# Patient Record
Sex: Female | Born: 1960
Health system: Southern US, Community
[De-identification: ages and names within clinical notes are randomized; demographics above are authoritative.]

## PROBLEM LIST (undated history)

## (undated) DIAGNOSIS — E079 Disorder of thyroid, unspecified: Secondary | ICD-10-CM

## (undated) DIAGNOSIS — E1110 Type 2 diabetes mellitus with ketoacidosis without coma: Secondary | ICD-10-CM

## (undated) DIAGNOSIS — Z8616 Personal history of COVID-19: Secondary | ICD-10-CM

## (undated) DIAGNOSIS — F419 Anxiety disorder, unspecified: Secondary | ICD-10-CM

## (undated) DIAGNOSIS — G8929 Other chronic pain: Secondary | ICD-10-CM

## (undated) DIAGNOSIS — M549 Dorsalgia, unspecified: Secondary | ICD-10-CM

## (undated) DIAGNOSIS — Z8669 Personal history of other diseases of the nervous system and sense organs: Secondary | ICD-10-CM

## (undated) DIAGNOSIS — F32A Depression, unspecified: Secondary | ICD-10-CM

## (undated) DIAGNOSIS — Z872 Personal history of diseases of the skin and subcutaneous tissue: Secondary | ICD-10-CM

## (undated) DIAGNOSIS — E119 Type 2 diabetes mellitus without complications: Secondary | ICD-10-CM

## (undated) DIAGNOSIS — F329 Major depressive disorder, single episode, unspecified: Secondary | ICD-10-CM

## (undated) HISTORY — DX: Personal history of COVID-19: Z86.16

## (undated) HISTORY — DX: Other chronic pain: G89.29

## (undated) HISTORY — DX: Depression, unspecified: F32.A

## (undated) HISTORY — DX: Anxiety disorder, unspecified: F41.9

## (undated) HISTORY — DX: Disorder of thyroid, unspecified: E07.9

## (undated) HISTORY — PX: TUBAL LIGATION: SHX77

## (undated) HISTORY — DX: Major depressive disorder, single episode, unspecified: F32.9

## (undated) HISTORY — PX: POLYPECTOMY: SHX149

## (undated) HISTORY — DX: Type 2 diabetes mellitus with ketoacidosis without coma: E11.10

## (undated) HISTORY — DX: Personal history of other diseases of the nervous system and sense organs: Z86.69

## (undated) HISTORY — DX: Personal history of diseases of the skin and subcutaneous tissue: Z87.2

## (undated) HISTORY — DX: Dorsalgia, unspecified: M54.9

---

## 1999-02-13 ENCOUNTER — Other Ambulatory Visit: Admission: RE | Admit: 1999-02-13 | Discharge: 1999-02-13 | Payer: Self-pay | Admitting: Internal Medicine

## 1999-05-24 ENCOUNTER — Ambulatory Visit (HOSPITAL_COMMUNITY): Admission: RE | Admit: 1999-05-24 | Discharge: 1999-05-24 | Payer: Self-pay | Admitting: Obstetrics and Gynecology

## 2000-09-16 ENCOUNTER — Other Ambulatory Visit: Admission: RE | Admit: 2000-09-16 | Discharge: 2000-09-16 | Payer: Self-pay | Admitting: Internal Medicine

## 2001-10-22 ENCOUNTER — Other Ambulatory Visit: Admission: RE | Admit: 2001-10-22 | Discharge: 2001-10-22 | Payer: Self-pay | Admitting: Internal Medicine

## 2002-10-01 ENCOUNTER — Other Ambulatory Visit: Admission: RE | Admit: 2002-10-01 | Discharge: 2002-10-01 | Payer: Self-pay | Admitting: Internal Medicine

## 2003-04-21 ENCOUNTER — Encounter: Payer: Self-pay | Admitting: Internal Medicine

## 2003-04-21 ENCOUNTER — Encounter: Admission: RE | Admit: 2003-04-21 | Discharge: 2003-04-21 | Payer: Self-pay | Admitting: Internal Medicine

## 2004-02-15 ENCOUNTER — Other Ambulatory Visit: Admission: RE | Admit: 2004-02-15 | Discharge: 2004-02-15 | Payer: Self-pay | Admitting: Internal Medicine

## 2005-06-19 ENCOUNTER — Other Ambulatory Visit: Admission: RE | Admit: 2005-06-19 | Discharge: 2005-06-19 | Payer: Self-pay | Admitting: Internal Medicine

## 2005-07-27 HISTORY — PX: ABDOMINOPLASTY: SUR9

## 2006-04-26 HISTORY — PX: BREAST ENHANCEMENT SURGERY: SHX7

## 2006-06-18 ENCOUNTER — Other Ambulatory Visit: Admission: RE | Admit: 2006-06-18 | Discharge: 2006-06-18 | Payer: Self-pay | Admitting: Internal Medicine

## 2007-06-24 ENCOUNTER — Other Ambulatory Visit: Admission: RE | Admit: 2007-06-24 | Discharge: 2007-06-24 | Payer: Self-pay | Admitting: Internal Medicine

## 2008-09-14 ENCOUNTER — Other Ambulatory Visit: Admission: RE | Admit: 2008-09-14 | Discharge: 2008-09-14 | Payer: Self-pay | Admitting: Internal Medicine

## 2008-09-14 ENCOUNTER — Ambulatory Visit: Payer: Self-pay | Admitting: Internal Medicine

## 2010-07-11 ENCOUNTER — Other Ambulatory Visit: Admission: RE | Admit: 2010-07-11 | Discharge: 2010-07-11 | Payer: Self-pay | Admitting: Internal Medicine

## 2010-07-11 ENCOUNTER — Ambulatory Visit: Payer: Self-pay | Admitting: Internal Medicine

## 2010-08-22 ENCOUNTER — Ambulatory Visit: Payer: Self-pay | Admitting: Internal Medicine

## 2010-10-17 ENCOUNTER — Ambulatory Visit: Payer: Self-pay | Admitting: Internal Medicine

## 2010-10-23 ENCOUNTER — Emergency Department (HOSPITAL_COMMUNITY): Admission: EM | Admit: 2010-10-23 | Discharge: 2010-10-23 | Payer: Self-pay | Admitting: Emergency Medicine

## 2010-12-12 ENCOUNTER — Ambulatory Visit
Admission: RE | Admit: 2010-12-12 | Discharge: 2010-12-12 | Payer: Self-pay | Source: Home / Self Care | Attending: Internal Medicine | Admitting: Internal Medicine

## 2011-02-07 LAB — CBC
HCT: 41.8 % (ref 36.0–46.0)
MCHC: 34.4 g/dL (ref 30.0–36.0)
MCV: 93.7 fL (ref 78.0–100.0)
RDW: 12.7 % (ref 11.5–15.5)

## 2011-02-07 LAB — COMPREHENSIVE METABOLIC PANEL
BUN: 24 mg/dL — ABNORMAL HIGH (ref 6–23)
Calcium: 9.5 mg/dL (ref 8.4–10.5)
Creatinine, Ser: 0.77 mg/dL (ref 0.4–1.2)
Glucose, Bld: 115 mg/dL — ABNORMAL HIGH (ref 70–99)
Total Protein: 7.5 g/dL (ref 6.0–8.3)

## 2011-02-07 LAB — URINALYSIS, ROUTINE W REFLEX MICROSCOPIC
Hgb urine dipstick: NEGATIVE
Nitrite: NEGATIVE
Protein, ur: NEGATIVE mg/dL
Specific Gravity, Urine: 1.028 (ref 1.005–1.030)
Urobilinogen, UA: 0.2 mg/dL (ref 0.0–1.0)

## 2011-02-07 LAB — DIFFERENTIAL
Basophils Absolute: 0 10*3/uL (ref 0.0–0.1)
Basophils Relative: 0 % (ref 0–1)
Lymphocytes Relative: 8 % — ABNORMAL LOW (ref 12–46)
Monocytes Absolute: 0.3 10*3/uL (ref 0.1–1.0)
Neutro Abs: 9.2 10*3/uL — ABNORMAL HIGH (ref 1.7–7.7)
Neutrophils Relative %: 89 % — ABNORMAL HIGH (ref 43–77)

## 2011-02-07 LAB — LIPASE, BLOOD: Lipase: 19 U/L (ref 11–59)

## 2011-02-07 LAB — POCT PREGNANCY, URINE: Preg Test, Ur: NEGATIVE

## 2011-02-27 ENCOUNTER — Ambulatory Visit (INDEPENDENT_AMBULATORY_CARE_PROVIDER_SITE_OTHER): Payer: 59 | Admitting: Internal Medicine

## 2011-02-27 DIAGNOSIS — N958 Other specified menopausal and perimenopausal disorders: Secondary | ICD-10-CM

## 2011-02-27 DIAGNOSIS — E039 Hypothyroidism, unspecified: Secondary | ICD-10-CM

## 2011-04-06 ENCOUNTER — Telehealth: Payer: Self-pay | Admitting: Internal Medicine

## 2011-04-06 NOTE — Telephone Encounter (Signed)
Pt called for instruction on how to take Loestrin.  Instructed pt to take the pill once menses starts, and take it at the same time everyday.  Pt verbalized understanding and denied any further questions.

## 2011-04-18 LAB — HM MAMMOGRAPHY

## 2011-09-05 ENCOUNTER — Encounter: Payer: Self-pay | Admitting: Internal Medicine

## 2011-09-10 ENCOUNTER — Other Ambulatory Visit: Payer: 59 | Admitting: Internal Medicine

## 2011-09-10 DIAGNOSIS — Z0000 Encounter for general adult medical examination without abnormal findings: Secondary | ICD-10-CM

## 2011-09-10 LAB — CBC WITH DIFFERENTIAL/PLATELET
HCT: 38.9 % (ref 36.0–46.0)
Hemoglobin: 12.5 g/dL (ref 12.0–15.0)
Lymphs Abs: 2.1 10*3/uL (ref 0.7–4.0)
MCH: 30.6 pg (ref 26.0–34.0)
Monocytes Absolute: 0.3 10*3/uL (ref 0.1–1.0)
Monocytes Relative: 6 % (ref 3–12)
Neutro Abs: 3.4 10*3/uL (ref 1.7–7.7)
Neutrophils Relative %: 57 % (ref 43–77)
RBC: 4.09 MIL/uL (ref 3.87–5.11)

## 2011-09-10 LAB — LIPID PANEL
Cholesterol: 176 mg/dL (ref 0–200)
Total CHOL/HDL Ratio: 2.8 Ratio
Triglycerides: 54 mg/dL (ref <150)
VLDL: 11 mg/dL (ref 0–40)

## 2011-09-10 LAB — COMPREHENSIVE METABOLIC PANEL
Albumin: 4.2 g/dL (ref 3.5–5.2)
Alkaline Phosphatase: 62 U/L (ref 39–117)
BUN: 20 mg/dL (ref 6–23)
Calcium: 9.4 mg/dL (ref 8.4–10.5)
Creat: 0.83 mg/dL (ref 0.50–1.10)
Glucose, Bld: 111 mg/dL — ABNORMAL HIGH (ref 70–99)
Potassium: 4.1 mEq/L (ref 3.5–5.3)

## 2011-09-11 ENCOUNTER — Other Ambulatory Visit (HOSPITAL_COMMUNITY)
Admission: RE | Admit: 2011-09-11 | Discharge: 2011-09-11 | Disposition: A | Discharge status: Home / Self Care | Payer: 59 | Source: Ambulatory Visit | Attending: Internal Medicine | Admitting: Internal Medicine

## 2011-09-11 ENCOUNTER — Ambulatory Visit (INDEPENDENT_AMBULATORY_CARE_PROVIDER_SITE_OTHER): Payer: 59 | Admitting: Internal Medicine

## 2011-09-11 ENCOUNTER — Encounter: Payer: Self-pay | Admitting: Internal Medicine

## 2011-09-11 DIAGNOSIS — Z0000 Encounter for general adult medical examination without abnormal findings: Secondary | ICD-10-CM

## 2011-09-11 DIAGNOSIS — M549 Dorsalgia, unspecified: Secondary | ICD-10-CM

## 2011-09-11 DIAGNOSIS — Z780 Asymptomatic menopausal state: Secondary | ICD-10-CM

## 2011-09-11 DIAGNOSIS — F329 Major depressive disorder, single episode, unspecified: Secondary | ICD-10-CM

## 2011-09-11 DIAGNOSIS — Z01419 Encounter for gynecological examination (general) (routine) without abnormal findings: Secondary | ICD-10-CM | POA: Insufficient documentation

## 2011-09-11 DIAGNOSIS — Z23 Encounter for immunization: Secondary | ICD-10-CM

## 2011-09-11 LAB — POCT URINALYSIS DIPSTICK
Bilirubin, UA: NEGATIVE
Blood, UA: NEGATIVE
Ketones, UA: NEGATIVE
Leukocytes, UA: NEGATIVE
Spec Grav, UA: 1.005
pH, UA: 7.5

## 2011-09-28 ENCOUNTER — Other Ambulatory Visit: Payer: Self-pay

## 2011-09-28 MED ORDER — NABUMETONE 500 MG PO TABS: 500.0000 mg | ORAL_TABLET | Freq: Two times a day (BID) | ORAL | Status: DC

## 2011-09-30 DIAGNOSIS — E559 Vitamin D deficiency, unspecified: Secondary | ICD-10-CM | POA: Insufficient documentation

## 2011-09-30 DIAGNOSIS — F329 Major depressive disorder, single episode, unspecified: Secondary | ICD-10-CM | POA: Insufficient documentation

## 2011-09-30 DIAGNOSIS — Z780 Asymptomatic menopausal state: Secondary | ICD-10-CM | POA: Insufficient documentation

## 2011-09-30 DIAGNOSIS — M549 Dorsalgia, unspecified: Secondary | ICD-10-CM | POA: Insufficient documentation

## 2011-09-30 NOTE — Patient Instructions (Signed)
Continue low dose Synthroid. Try to get more exercise to help with weight loss. It is possible Lexapro is contributing to inability to lose weight.

## 2011-09-30 NOTE — Progress Notes (Signed)
  Subjective:    Patient ID: Nancy Camacho, female    DOB: [birth year], 50 y.o.   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 Patient is menopausal. Has a history of depression. Has great concern about her weight. She was overweight as a teenager and went to [facility] to lose weight by diet and exercise. Since becoming menopausal has had some issues with weight gain in his been to the bariatric center and has been on phentermine often known for some time. History of Bell's palsy 1992. History of pseudotumor cerebri around 1990 treated by Dr. Sandria Manly. History of breast augmentation 2007, tubal ligation 1997 abdominoplasty 2006. Has annual mammogram. Tdap given August 2011. Gets annual influenza immunization. Have treated her with low dose Synthroid to help with fatigue and weight gain. TSH is checked regularly. History of vitamin D deficiency. Takes Relafen for chronic back pain.    Review of Systems  Constitutional:       Unable to lose weight  HENT: Negative.   Eyes: Negative.   Respiratory: Negative.   Cardiovascular: Negative.   Gastrointestinal: Negative.   Genitourinary:       Hot flashes  Musculoskeletal: Positive for back pain.  Neurological: Negative.   Psychiatric/Behavioral: Positive for dysphoric mood.       Objective:   Physical Exam  Constitutional: She is oriented to person, place, and time. She appears well-developed and well-nourished.  HENT:  Head: Atraumatic.  Right Ear: External ear normal.  Left Ear: External ear normal.  Mouth/Throat: Oropharynx is clear and moist.  Eyes: Conjunctivae and EOM are normal. Pupils are equal, round, and reactive to light. No scleral icterus.  Neck: Normal range of motion. Neck supple. No thyromegaly present.  Cardiovascular: Normal rate, regular rhythm, normal heart sounds and intact distal pulses.   No  murmur heard. Pulmonary/Chest: Effort normal and breath sounds normal. She has no wheezes. She has no rales.  Abdominal: Soft. Bowel sounds are normal. She exhibits no mass. There is no tenderness.  Genitourinary: Vagina normal and uterus normal.  Lymphadenopathy:    She has no cervical adenopathy.  Neurological: She is alert and oriented to person, place, and time. She has normal reflexes. No cranial nerve deficit. Coordination normal.  Skin: Skin is warm and dry. She is not diaphoretic.  Psychiatric: She has a normal mood and affect. Her behavior is normal.          Assessment & Plan:  History of chronic back pain treated with Relafen  History of depression  History of menopausal symptoms  History of pseudotumor cerebri around 1990  History of Bell's palsy 1992  History of vitamin D deficiency  History of fluid retention treated with Maxzide  History of inability to lose weight-etiology unclear. I have been over her diet regimen and 5 that she doesn't eat a great deal and eats mostly healthy foods. Doesn't get exercise because she works a great deal. From time to time have treated her with phentermine 37.5 mg daily. Formerly seen at [facility] center for weight loss.

## 2011-10-08 ENCOUNTER — Ambulatory Visit (INDEPENDENT_AMBULATORY_CARE_PROVIDER_SITE_OTHER): Payer: 59 | Admitting: Internal Medicine

## 2011-10-08 ENCOUNTER — Encounter: Payer: Self-pay | Admitting: Internal Medicine

## 2011-10-08 VITALS — BP 112/72 | HR 84 | Temp 97.9°F | Wt 204.0 lb

## 2011-10-08 DIAGNOSIS — H6693 Otitis media, unspecified, bilateral: Secondary | ICD-10-CM

## 2011-10-08 DIAGNOSIS — H6690 Otitis media, unspecified, unspecified ear: Secondary | ICD-10-CM

## 2011-10-08 NOTE — Progress Notes (Signed)
  Subjective:    Patient ID: Nancy Camacho, female    DOB: 1961-04-11, 50 y.o.   MRN: [id number]  HPI had URI symptoms last week. This morning realized she could not hear music that was playing. Music sounded muffled. No real ear pain.    Review of Systems     Objective:   Physical Exam HEENT exam: Both TMs are red and dull the left more so than the right. Pharynx is clear; neck is supple; chest is clear        Assessment & Plan:  Bilateral otitis media  Plan: Augmentin 500 mg by mouth 3 times daily for 10 days. Take Sudafed PE over-the-counter for decongestant

## 2011-10-08 NOTE — Patient Instructions (Signed)
Take Augmentin by mouth 3 times daily for 10 days. Call if not better in 48 hours or sooner if worse. Take Sudafed PE over-the-counter for decongestant

## 2011-10-09 ENCOUNTER — Encounter: Payer: Self-pay | Admitting: Internal Medicine

## 2011-10-16 ENCOUNTER — Other Ambulatory Visit: Payer: Self-pay | Admitting: Radiology

## 2011-10-17 ENCOUNTER — Encounter: Payer: Self-pay | Admitting: Internal Medicine

## 2011-10-22 ENCOUNTER — Encounter: Payer: Self-pay | Admitting: Internal Medicine

## 2012-01-03 IMAGING — CR DG ABDOMEN ACUTE W/ 1V CHEST
4 series · 4 of 4 positions shown · non-contrast
Comparison: None.

CLINICAL DATA: Upper abdominal pain with nausea and vomiting.

ACUTE ABDOMEN SERIES (ABDOMEN 2 VIEW & CHEST 1 VIEW)

[w chest pa]
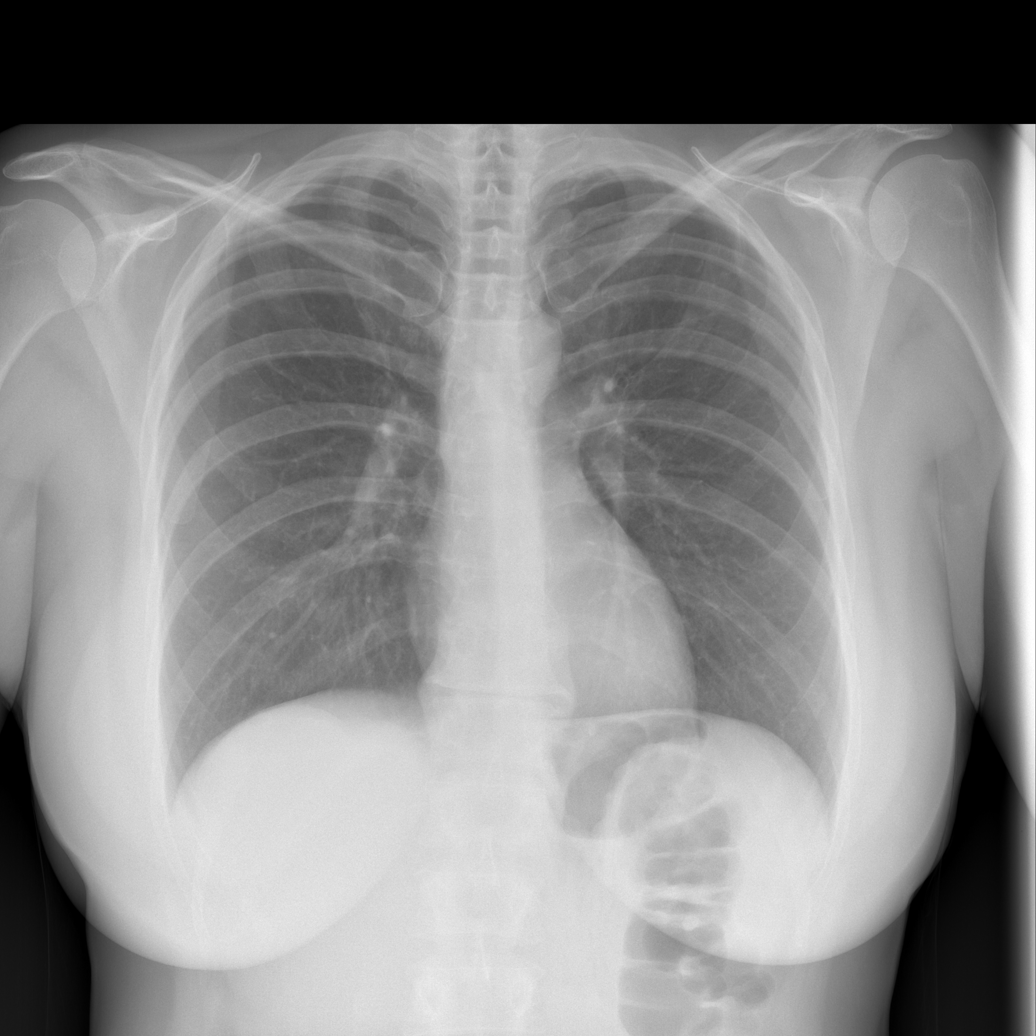

[w abdomen upright *]
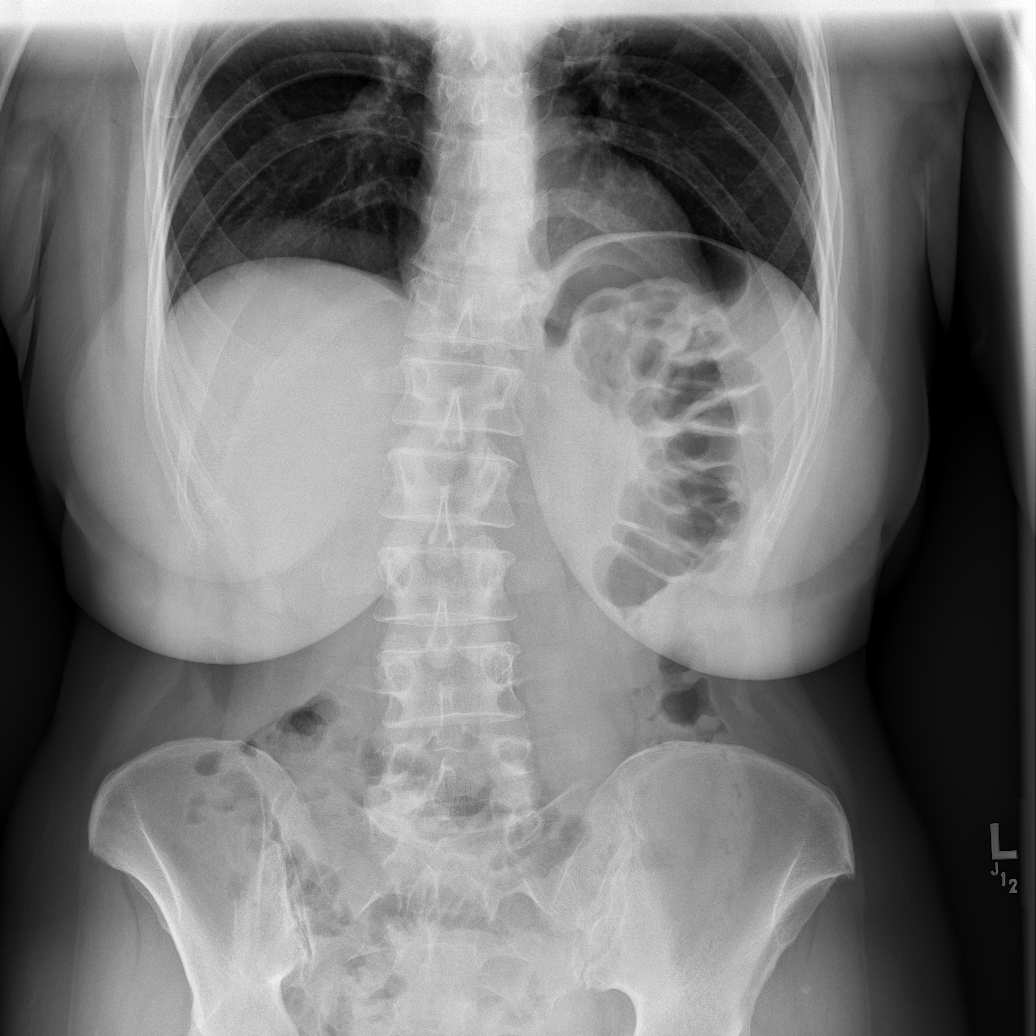

[t abdomen supine (1 of 2)]
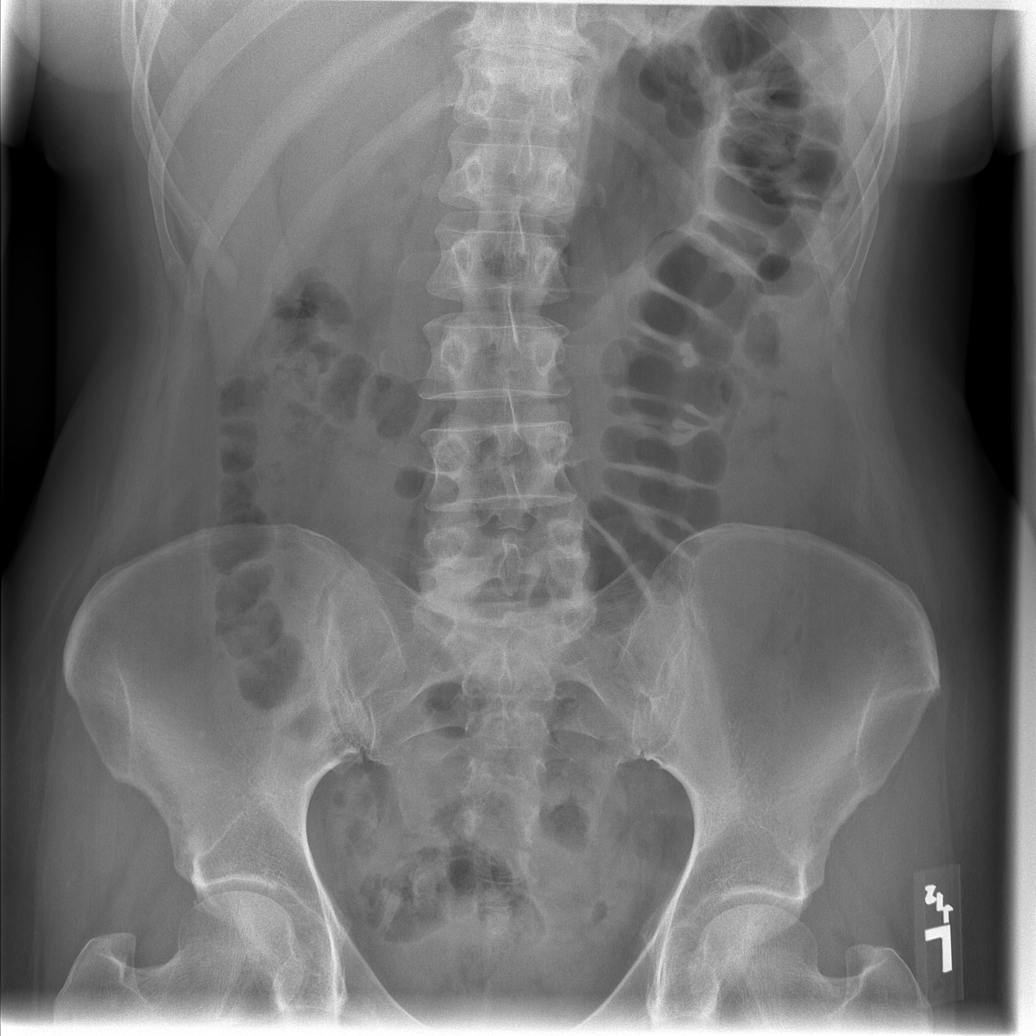

[t abdomen supine (2 of 2)]
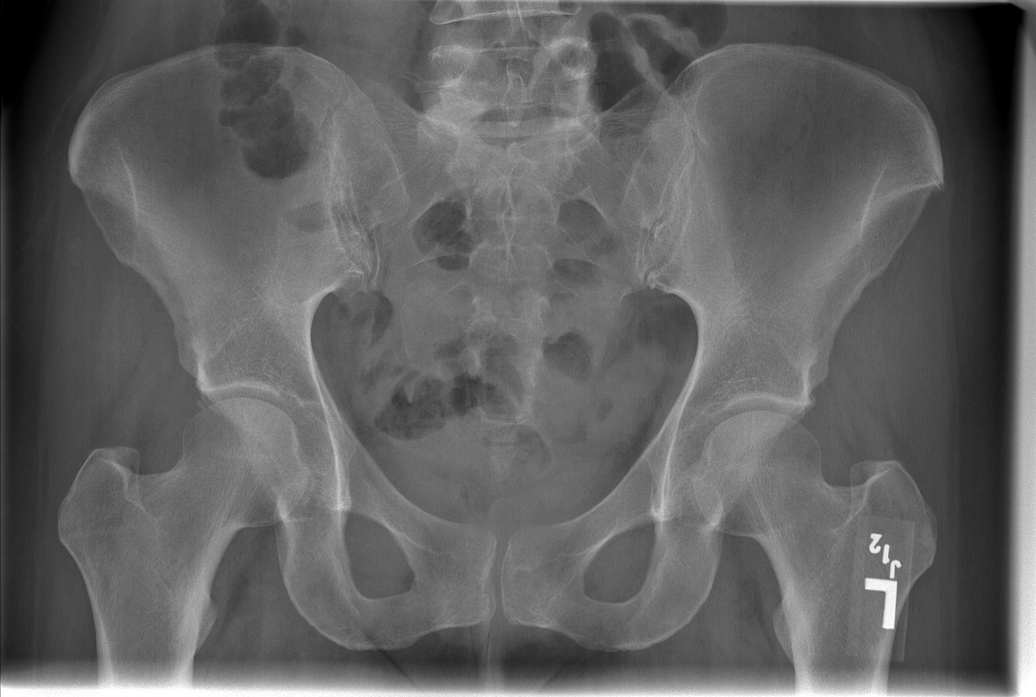

[4 of 4 positions shown; findings below may reference images not displayed]

FINDINGS: Frontal view of the chest shows midline trachea and
normal heart size.  Mild biapical pleural thickening.  Lungs are
otherwise clear.  No pleural fluid.

Two views of the abdomen show nondilated small bowel and colon.  No
unexpected radiopaque calculi.
IMPRESSION: No acute findings.

## 2012-02-12 ENCOUNTER — Encounter: Payer: Self-pay | Admitting: Internal Medicine

## 2012-03-29 ENCOUNTER — Other Ambulatory Visit: Payer: Self-pay | Admitting: Internal Medicine

## 2012-04-15 ENCOUNTER — Ambulatory Visit: Payer: 59 | Admitting: Internal Medicine

## 2012-05-16 ENCOUNTER — Other Ambulatory Visit: Payer: Self-pay | Admitting: Internal Medicine

## 2012-06-23 ENCOUNTER — Other Ambulatory Visit: Payer: Self-pay

## 2012-06-23 MED ORDER — SYNTHROID 50 MCG PO TABS: 50.0000 ug | ORAL_TABLET | Freq: Every day | ORAL | Status: DC

## 2012-07-29 ENCOUNTER — Other Ambulatory Visit: Payer: Self-pay

## 2012-07-29 MED ORDER — SYNTHROID 50 MCG PO TABS: 50.0000 ug | ORAL_TABLET | Freq: Every day | ORAL | Status: DC

## 2012-09-08 ENCOUNTER — Other Ambulatory Visit: Payer: 59 | Admitting: Internal Medicine

## 2012-09-08 DIAGNOSIS — F329 Major depressive disorder, single episode, unspecified: Secondary | ICD-10-CM

## 2012-09-08 DIAGNOSIS — Z0000 Encounter for general adult medical examination without abnormal findings: Secondary | ICD-10-CM

## 2012-09-08 DIAGNOSIS — E039 Hypothyroidism, unspecified: Secondary | ICD-10-CM

## 2012-09-08 LAB — COMPREHENSIVE METABOLIC PANEL
Albumin: 4 g/dL (ref 3.5–5.2)
Alkaline Phosphatase: 60 U/L (ref 39–117)
BUN: 14 mg/dL (ref 6–23)
Calcium: 9.2 mg/dL (ref 8.4–10.5)
Glucose, Bld: 101 mg/dL — ABNORMAL HIGH (ref 70–99)
Potassium: 3.8 mEq/L (ref 3.5–5.3)

## 2012-09-08 LAB — CBC WITH DIFFERENTIAL/PLATELET
Basophils Relative: 1 % (ref 0–1)
HCT: 35.6 % — ABNORMAL LOW (ref 36.0–46.0)
Hemoglobin: 12.1 g/dL (ref 12.0–15.0)
MCH: 31 pg (ref 26.0–34.0)
MCHC: 34 g/dL (ref 30.0–36.0)
MCV: 91.3 fL (ref 78.0–100.0)
Monocytes Absolute: 0.4 10*3/uL (ref 0.1–1.0)
Monocytes Relative: 7 % (ref 3–12)
Neutro Abs: 2.7 10*3/uL (ref 1.7–7.7)

## 2012-09-08 LAB — LIPID PANEL
HDL: 52 mg/dL (ref >39)
LDL Cholesterol: 89 mg/dL (ref 0–99)
Triglycerides: 41 mg/dL (ref <150)

## 2012-09-09 ENCOUNTER — Ambulatory Visit (INDEPENDENT_AMBULATORY_CARE_PROVIDER_SITE_OTHER): Payer: 59 | Admitting: Internal Medicine

## 2012-09-09 ENCOUNTER — Encounter: Payer: Self-pay | Admitting: Internal Medicine

## 2012-09-09 VITALS — BP 116/78 | HR 80 | Temp 98.2°F | Ht 67.5 in | Wt 191.0 lb

## 2012-09-09 DIAGNOSIS — F4321 Adjustment disorder with depressed mood: Secondary | ICD-10-CM

## 2012-09-09 DIAGNOSIS — Z0000 Encounter for general adult medical examination without abnormal findings: Secondary | ICD-10-CM

## 2012-09-09 DIAGNOSIS — G8929 Other chronic pain: Secondary | ICD-10-CM

## 2012-09-09 DIAGNOSIS — Z23 Encounter for immunization: Secondary | ICD-10-CM

## 2012-09-09 DIAGNOSIS — Z139 Encounter for screening, unspecified: Secondary | ICD-10-CM

## 2012-09-09 DIAGNOSIS — M549 Dorsalgia, unspecified: Secondary | ICD-10-CM

## 2012-09-09 LAB — POCT URINALYSIS DIPSTICK
Bilirubin, UA: NEGATIVE
Glucose, UA: NEGATIVE
Ketones, UA: NEGATIVE
Spec Grav, UA: <=1.005

## 2012-09-09 NOTE — Patient Instructions (Addendum)
Continue same meds and return in one year. You have received influenza vaccine today.

## 2012-09-25 NOTE — Progress Notes (Signed)
Subjective:    Patient ID: Nancy Camacho, female    DOB: Feb 14, 1961, 51 y.o.   MRN: [id number]  HPI 51 year old white female in today for health maintenance exam. She sees Valinda Hoar in Dr. Loralie Champagne office for depression. Patient's father is in poor health and has been upsetting to her. History of recurrent low back pain for many years. History of menopausal symptoms and vitamin D deficiency. Says that psychiatrist once EKG done because she is thinking of starting her on attention deficit medication. Patient takes generic Relafen for back pain.  History of Bell's palsy October 1990. Bell's palsy was thought to be related to pseudotumor cerebra and was treated with Diamox for about a year. had abdominoplasty September 2006, breast augmentation June 2007. Bilateral tubal ligation 1997. Tetanus immunization August 2011. Gets annual influenza immunization.  She was overweight in high school but subsequently lost weight. At one point in 1988 she weighed 152 pounds. In 2008 she weighed 178 pounds. She's not pleased with her weight. She's tried phentermine also known for weight control. Was treated with Armour Thyroid by another physician. Her TSH was never abnormal. However I placed her on low dose Synthroid 0.05 mg in 2011 to try to keep her TSH around 1.00 to help with weight loss if possible and energy. We have left her on this.  Social history: She has one stepson. Is married. Quit smoking 1998.  Family history: One sister in good health. Mother with high blood pressure.    Review of Systems  Constitutional:       Unable to lose weight  HENT: Negative.   Eyes: Negative.   Respiratory: Negative.   Cardiovascular: Negative.   Gastrointestinal: Negative.   Genitourinary: Negative.   Musculoskeletal:       History of low back  Neurological: Negative.   Psychiatric/Behavioral: Positive for dysphoric mood.       Objective:   Physical Exam  Vitals reviewed. Constitutional: She is  oriented to person, place, and time. She appears well-developed and well-nourished. No distress.  HENT:  Head: Normocephalic and atraumatic.  Right Ear: External ear normal.  Left Ear: External ear normal.  Mouth/Throat: Oropharynx is clear and moist. No oropharyngeal exudate.  Eyes: EOM are normal. Right eye exhibits no discharge. Left eye exhibits no discharge. No scleral icterus.  Neck: Neck supple. No JVD present. No tracheal deviation present. No thyromegaly present.  Cardiovascular: Normal rate, regular rhythm, normal heart sounds and intact distal pulses.   No murmur heard. Pulmonary/Chest: Effort normal and breath sounds normal. She has no wheezes. She has no rales.  Abdominal: Bowel sounds are normal. She exhibits no distension and no mass. There is no tenderness. There is no rebound.  Genitourinary:       Bimanual normal  Musculoskeletal: She exhibits no edema.  Lymphadenopathy:    She has no cervical adenopathy.  Neurological: She is alert and oriented to person, place, and time. She has normal reflexes. She displays normal reflexes. No cranial nerve deficit. Coordination normal.  Skin: Skin is warm and dry. No rash noted. She is not diaphoretic.  Psychiatric: She has a normal mood and affect. Her behavior is normal. Judgment and thought content normal.       Depressed over her father's illness          Assessment & Plan:  Depression  Menopause  Recurrent low back pain  History of vitamin D deficiency  Unable to lose weight  Plan: Fasting labs drawn and are pending. Influenza  immunization given. Return in one year or as needed. For now continue low dose Synthroid.

## 2012-10-20 ENCOUNTER — Other Ambulatory Visit: Payer: Self-pay

## 2012-10-20 MED ORDER — NABUMETONE 500 MG PO TABS: 500.0000 mg | ORAL_TABLET | Freq: Two times a day (BID) | ORAL | Status: DC

## 2012-12-06 ENCOUNTER — Emergency Department (HOSPITAL_BASED_OUTPATIENT_CLINIC_OR_DEPARTMENT_OTHER)
Admission: EM | Admit: 2012-12-06 | Discharge: 2012-12-06 | Disposition: A | Discharge status: Home / Self Care | Payer: 59 | Attending: Emergency Medicine | Admitting: Emergency Medicine

## 2012-12-06 ENCOUNTER — Encounter (HOSPITAL_BASED_OUTPATIENT_CLINIC_OR_DEPARTMENT_OTHER): Payer: Self-pay | Admitting: Emergency Medicine

## 2012-12-06 DIAGNOSIS — E079 Disorder of thyroid, unspecified: Secondary | ICD-10-CM | POA: Insufficient documentation

## 2012-12-06 DIAGNOSIS — Z8669 Personal history of other diseases of the nervous system and sense organs: Secondary | ICD-10-CM | POA: Insufficient documentation

## 2012-12-06 DIAGNOSIS — Z872 Personal history of diseases of the skin and subcutaneous tissue: Secondary | ICD-10-CM | POA: Insufficient documentation

## 2012-12-06 DIAGNOSIS — M549 Dorsalgia, unspecified: Secondary | ICD-10-CM | POA: Insufficient documentation

## 2012-12-06 DIAGNOSIS — R509 Fever, unspecified: Secondary | ICD-10-CM | POA: Insufficient documentation

## 2012-12-06 DIAGNOSIS — F3289 Other specified depressive episodes: Secondary | ICD-10-CM | POA: Insufficient documentation

## 2012-12-06 DIAGNOSIS — Z79899 Other long term (current) drug therapy: Secondary | ICD-10-CM | POA: Insufficient documentation

## 2012-12-06 DIAGNOSIS — R197 Diarrhea, unspecified: Secondary | ICD-10-CM | POA: Insufficient documentation

## 2012-12-06 DIAGNOSIS — B9789 Other viral agents as the cause of diseases classified elsewhere: Secondary | ICD-10-CM | POA: Insufficient documentation

## 2012-12-06 DIAGNOSIS — G8929 Other chronic pain: Secondary | ICD-10-CM | POA: Insufficient documentation

## 2012-12-06 DIAGNOSIS — F329 Major depressive disorder, single episode, unspecified: Secondary | ICD-10-CM | POA: Insufficient documentation

## 2012-12-06 DIAGNOSIS — B349 Viral infection, unspecified: Secondary | ICD-10-CM

## 2012-12-06 DIAGNOSIS — Z87891 Personal history of nicotine dependence: Secondary | ICD-10-CM | POA: Insufficient documentation

## 2012-12-06 DIAGNOSIS — R112 Nausea with vomiting, unspecified: Secondary | ICD-10-CM | POA: Insufficient documentation

## 2012-12-06 LAB — URINALYSIS, ROUTINE W REFLEX MICROSCOPIC
Bilirubin Urine: NEGATIVE
Hgb urine dipstick: NEGATIVE
Ketones, ur: NEGATIVE mg/dL
Nitrite: NEGATIVE
Urobilinogen, UA: 0.2 mg/dL (ref 0.0–1.0)

## 2012-12-06 LAB — BASIC METABOLIC PANEL
BUN: 23 mg/dL (ref 6–23)
Calcium: 9.3 mg/dL (ref 8.4–10.5)
Creatinine, Ser: 0.6 mg/dL (ref 0.50–1.10)
GFR calc Af Amer: >90 mL/min (ref >90)
GFR calc non Af Amer: >90 mL/min (ref >90)

## 2012-12-06 LAB — CBC WITH DIFFERENTIAL/PLATELET
Basophils Relative: 0 % (ref 0–1)
Eosinophils Absolute: 0 10*3/uL (ref 0.0–0.7)
Eosinophils Relative: 0 % (ref 0–5)
HCT: 40.2 % (ref 36.0–46.0)
Hemoglobin: 13.6 g/dL (ref 12.0–15.0)
MCH: 31.2 pg (ref 26.0–34.0)
MCHC: 33.8 g/dL (ref 30.0–36.0)
MCV: 92.2 fL (ref 78.0–100.0)
Monocytes Absolute: 0.2 10*3/uL (ref 0.1–1.0)
Monocytes Relative: 2 % — ABNORMAL LOW (ref 3–12)
Neutrophils Relative %: 94 % — ABNORMAL HIGH (ref 43–77)

## 2012-12-06 MED ORDER — SODIUM CHLORIDE 0.9 % IV SOLN
Freq: Once | INTRAVENOUS | Status: AC
  Administered 2012-12-06: 19:00:00 via INTRAVENOUS

## 2012-12-06 MED ORDER — IBUPROFEN 800 MG PO TABS
800.0000 mg | ORAL_TABLET | Freq: Once | ORAL | Status: AC
  Administered 2012-12-06: 800 mg via ORAL
  Filled 2012-12-06: qty 1

## 2012-12-06 NOTE — ED Provider Notes (Signed)
History     CSN: [id number]  Arrival date & time 12/06/12  1741   First MD Initiated Contact with Patient 12/06/12 1823      Chief Complaint  Patient presents with  . Illegal value: [    Flu Like Symptoms    (Consider location/radiation/quality/duration/timing/severity/associated sxs/prior treatment) Patient is a 52 y.o. female presenting with musculoskeletal pain. The history is provided by the patient. No language interpreter was used.  Muscle Pain This is a new problem. The current episode started today. The problem occurs constantly. The problem has been gradually worsening. Associated symptoms include myalgias, nausea and vomiting. Nothing aggravates the symptoms. She has tried acetaminophen for the symptoms. The treatment provided no relief.  Pt reports she has body aches, fever, vomiting and diarrhea.   Pt reports she had a flu shot.    Past Medical History  Diagnosis Date  . Depression   . Thyroid disease   . Chronic back pain   . History of seborrhea     ear  . History of Bell's palsy     Past Surgical History  Procedure Date  . Abdominoplasty 07/2005  . Breast enhancement surgery 04/2006  . Tubal ligation     No family history on file.  History  Substance Use Topics  . Smoking status: Former Games developer  . Smokeless tobacco: Not on file  . Alcohol Use: No    OB History    Grav Para Term Preterm Abortions TAB SAB Ect Mult Living                  Review of Systems  Gastrointestinal: Positive for nausea and vomiting.  Musculoskeletal: Positive for myalgias.  All other systems reviewed and are negative.    Allergies  Flexeril  Home Medications   Current Outpatient Rx  Name  Route  Sig  Dispense  Refill  . ALPRAZOLAM 0.5 MG PO TABS               . BUPROPION HCL ER (XL) 150 MG PO TB24               . ESTRIOL POWD               . HYDROCORTISONE 2.5 % EX CREA      APPLY TO RASH SPARINGLY.   30 g   1   . LIOTHYRONINE SODIUM 5 MCG  PO TABS   Oral   Take 5 mcg by mouth daily.         Marland Kitchen NABUMETONE 500 MG PO TABS   Oral   Take 1 tablet (500 mg total) by mouth 2 (two) times daily.   60 tablet   6   . PROGESTERONE MICRONIZED 100 MG PO CAPS   Oral   Take 100 mg by mouth daily.         Marland Kitchen SYNTHROID 50 MCG PO TABS   Oral   Take 1 tablet (50 mcg total) by mouth daily.   30 each   3     Dispense as written.    Due for labs   . VILAZODONE HCL 40 MG PO TABS   Oral   Take by mouth.             BP 121/75  Pulse 97  Temp 98.1 F (36.7 C) (Oral)  Resp 18  Ht 5' 8.5" (1.74 m)  Wt 175 lb (79.379 kg)  BMI 26.22 kg/m2  SpO2 100%  LMP 09/17/2011  Physical Exam  Nursing note and vitals reviewed. Constitutional: She is oriented to person, place, and time. She appears well-developed and well-nourished.  HENT:  Head: Normocephalic and atraumatic.  Right Ear: External ear normal.  Left Ear: External ear normal.  Nose: Nose normal.  Mouth/Throat: Oropharynx is clear and moist.  Eyes: Conjunctivae normal and EOM are normal. Pupils are equal, round, and reactive to light.  Neck: Normal range of motion. Neck supple.  Cardiovascular: Normal rate and normal heart sounds.   Pulmonary/Chest: Effort normal.  Abdominal: Soft. There is no tenderness.  Musculoskeletal: Normal range of motion.  Neurological: She is alert and oriented to person, place, and time. She has normal reflexes.  Skin: Skin is warm.  Psychiatric: She has a normal mood and affect.    ED Course  Procedures (including critical care time)  Labs Reviewed - No data to display No results found.   No diagnosis found.    MDM  Pt reports she thinks she is dehydrated.  Pt requesting IV fluids.  Pt is afebrile vitals normal,  Urine no ketones,  Labs obtained,   Pt given Iv fluids x 1 liter,   Ibuprofen for discomfort. Pt reports she feels some better.    Illness sounds viral, flu like,   Pt did have a flu shot.    I advised follow up with  primary care for recheck on Monday.          Lonia Skinner [place], [place] 12/06/12 2119

## 2012-12-06 NOTE — ED Notes (Signed)
ED PA at bedside

## 2012-12-06 NOTE — ED Notes (Signed)
Pt c/o N/V/D, fever, generalized body aches since 0530 this am. Pt sts took tylenol at 1500 fro pain and Zofran 8mg  at 1000 & 1600.

## 2012-12-06 NOTE — ED Provider Notes (Signed)
Medical screening examination/treatment/procedure(s) were performed by non-physician practitioner and as supervising physician I was immediately available for consultation/collaboration.   Gavin Pound. Oletta Lamas, MD 12/06/12 2125

## 2012-12-06 NOTE — ED Notes (Signed)
Patient ambulatory to the restroom with standby assistance from staff.  Patient given instruction to obtain clean catch UA.

## 2013-05-08 ENCOUNTER — Other Ambulatory Visit: Payer: Self-pay | Admitting: Internal Medicine

## 2013-05-08 ENCOUNTER — Other Ambulatory Visit: Payer: 59 | Admitting: Internal Medicine

## 2013-05-08 DIAGNOSIS — R7301 Impaired fasting glucose: Secondary | ICD-10-CM

## 2013-05-08 LAB — CBC WITH DIFFERENTIAL/PLATELET
Basophils Relative: 1 % (ref 0–1)
Eosinophils Absolute: 0.1 10*3/uL (ref 0.0–0.7)
Eosinophils Relative: 1 % (ref 0–5)
MCH: 30.4 pg (ref 26.0–34.0)
MCHC: 33.9 g/dL (ref 30.0–36.0)
MCV: 89.7 fL (ref 78.0–100.0)
Monocytes Relative: 7 % (ref 3–12)
Neutrophils Relative %: 50 % (ref 43–77)
Platelets: 288 10*3/uL (ref 150–400)

## 2013-05-08 LAB — HEMOGLOBIN A1C: Mean Plasma Glucose: 232 mg/dL — ABNORMAL HIGH (ref <117)

## 2013-05-09 LAB — LIPID PANEL
Cholesterol: 153 mg/dL (ref 0–200)
HDL: 63 mg/dL (ref >39)
LDL Cholesterol: 79 mg/dL (ref 0–99)
Total CHOL/HDL Ratio: 2.4 Ratio
Triglycerides: 53 mg/dL (ref <150)
VLDL: 11 mg/dL (ref 0–40)

## 2013-05-09 LAB — COMPREHENSIVE METABOLIC PANEL
Alkaline Phosphatase: 88 U/L (ref 39–117)
CO2: 28 mEq/L (ref 19–32)
Creat: 0.7 mg/dL (ref 0.50–1.10)
Glucose, Bld: 270 mg/dL — ABNORMAL HIGH (ref 70–99)
Sodium: 134 mEq/L — ABNORMAL LOW (ref 135–145)
Total Bilirubin: 0.8 mg/dL (ref 0.3–1.2)
Total Protein: 6.7 g/dL (ref 6.0–8.3)

## 2013-05-11 ENCOUNTER — Encounter: Payer: Self-pay | Admitting: Internal Medicine

## 2013-05-11 ENCOUNTER — Ambulatory Visit (INDEPENDENT_AMBULATORY_CARE_PROVIDER_SITE_OTHER): Payer: 59 | Admitting: Internal Medicine

## 2013-05-11 VITALS — BP 122/84 | HR 72 | Temp 98.0°F | Wt 175.0 lb

## 2013-05-11 DIAGNOSIS — E119 Type 2 diabetes mellitus without complications: Secondary | ICD-10-CM | POA: Insufficient documentation

## 2013-05-11 LAB — POCT URINALYSIS DIPSTICK
Bilirubin, UA: NEGATIVE
Blood, UA: NEGATIVE
Nitrite, UA: NEGATIVE
Spec Grav, UA: 1.01
pH, UA: 6

## 2013-05-11 MED ORDER — SITAGLIPTIN PHOSPHATE 100 MG PO TABS: 100.0000 mg | ORAL_TABLET | Freq: Every day | ORAL | Status: DC

## 2013-05-14 ENCOUNTER — Telehealth: Payer: Self-pay | Admitting: Internal Medicine

## 2013-05-14 ENCOUNTER — Emergency Department (HOSPITAL_BASED_OUTPATIENT_CLINIC_OR_DEPARTMENT_OTHER)
Admission: EM | Admit: 2013-05-14 | Discharge: 2013-05-14 | Disposition: A | Discharge status: Home / Self Care | Payer: 59 | Attending: Emergency Medicine | Admitting: Emergency Medicine

## 2013-05-14 ENCOUNTER — Encounter (HOSPITAL_BASED_OUTPATIENT_CLINIC_OR_DEPARTMENT_OTHER): Payer: Self-pay | Admitting: *Deleted

## 2013-05-14 DIAGNOSIS — Z87891 Personal history of nicotine dependence: Secondary | ICD-10-CM | POA: Insufficient documentation

## 2013-05-14 DIAGNOSIS — Z8669 Personal history of other diseases of the nervous system and sense organs: Secondary | ICD-10-CM | POA: Insufficient documentation

## 2013-05-14 DIAGNOSIS — F3289 Other specified depressive episodes: Secondary | ICD-10-CM | POA: Insufficient documentation

## 2013-05-14 DIAGNOSIS — Z9851 Tubal ligation status: Secondary | ICD-10-CM | POA: Insufficient documentation

## 2013-05-14 DIAGNOSIS — E119 Type 2 diabetes mellitus without complications: Secondary | ICD-10-CM | POA: Insufficient documentation

## 2013-05-14 DIAGNOSIS — Z872 Personal history of diseases of the skin and subcutaneous tissue: Secondary | ICD-10-CM | POA: Insufficient documentation

## 2013-05-14 DIAGNOSIS — E079 Disorder of thyroid, unspecified: Secondary | ICD-10-CM | POA: Insufficient documentation

## 2013-05-14 DIAGNOSIS — Z8739 Personal history of other diseases of the musculoskeletal system and connective tissue: Secondary | ICD-10-CM | POA: Insufficient documentation

## 2013-05-14 DIAGNOSIS — G8929 Other chronic pain: Secondary | ICD-10-CM | POA: Insufficient documentation

## 2013-05-14 DIAGNOSIS — Z79899 Other long term (current) drug therapy: Secondary | ICD-10-CM | POA: Insufficient documentation

## 2013-05-14 DIAGNOSIS — F329 Major depressive disorder, single episode, unspecified: Secondary | ICD-10-CM | POA: Insufficient documentation

## 2013-05-14 DIAGNOSIS — J3489 Other specified disorders of nose and nasal sinuses: Secondary | ICD-10-CM | POA: Insufficient documentation

## 2013-05-14 HISTORY — DX: Type 2 diabetes mellitus without complications: E11.9

## 2013-05-14 LAB — URINALYSIS, ROUTINE W REFLEX MICROSCOPIC
Bilirubin Urine: NEGATIVE
Glucose, UA: >1000 mg/dL — AB
Hgb urine dipstick: NEGATIVE
Specific Gravity, Urine: 1.015 (ref 1.005–1.030)
Urobilinogen, UA: 0.2 mg/dL (ref 0.0–1.0)
pH: 5.5 (ref 5.0–8.0)

## 2013-05-14 LAB — URINE MICROSCOPIC-ADD ON

## 2013-05-14 NOTE — ED Notes (Signed)
New onset diabetes. States her blood sugars and A1C are elevated. Appears anxious.

## 2013-05-14 NOTE — ED Provider Notes (Signed)
History     CSN: [id number]  Arrival date & time 05/14/13  2058   First MD Initiated Contact with Patient 05/14/13 2114      Chief Complaint  Patient presents with  . Diabetes    (Consider location/radiation/quality/duration/timing/severity/associated sxs/prior treatment) Patient is a 52 y.o. female presenting with diabetes problem. The history is provided by the patient.  Diabetes This is a new problem. Episode onset: 3 days ago. The problem occurs constantly. The problem has not changed since onset.Pertinent negatives include no abdominal pain and no shortness of breath. Associated symptoms comments: States started [place] 3 days ago and blood sugars have not improved despite diet changes.  Spoke with oncall doctor who stated she needed her urine checked for ketones and possible medication changes. Nothing aggravates the symptoms. Nothing relieves the symptoms. Treatments tried: exercise and diet change. The treatment provided no relief.    Past Medical History  Diagnosis Date  . Depression   . Thyroid disease   . Chronic back pain   . History of seborrhea     ear  . History of Bell's palsy   . Diabetes mellitus without complication     Past Surgical History  Procedure Laterality Date  . Abdominoplasty  07/2005  . Breast enhancement surgery  04/2006  . Tubal ligation      No family history on file.  History  Substance Use Topics  . Smoking status: Former Games developer  . Smokeless tobacco: Not on file  . Alcohol Use: No    OB History   Grav Para Term Preterm Abortions TAB SAB Ect Mult Living                  Review of Systems  Constitutional: Negative for fever.  HENT: Positive for congestion and rhinorrhea.   Respiratory: Negative for cough and shortness of breath.   Gastrointestinal: Negative for nausea, vomiting and abdominal pain.  Genitourinary: Negative for dysuria.  All other systems reviewed and are negative.    Allergies  Flexeril  Home  Medications   Current Outpatient Rx  Name  Route  Sig  Dispense  Refill  . ALPRAZolam (XANAX) 0.5 MG tablet               . hydrocortisone 2.5 % cream      APPLY TO RASH SPARINGLY.   30 g   1   . liothyronine (CYTOMEL) 5 MCG tablet   Oral   Take 5 mcg by mouth daily.         . nabumetone (RELAFEN) 500 MG tablet   Oral   Take 1 tablet (500 mg total) by mouth 2 (two) times daily.   60 tablet   6   . sitaGLIPtin (JANUVIA) 100 MG tablet   Oral   Take 1 tablet (100 mg total) by mouth daily.   30 tablet   0   . SYNTHROID 50 MCG tablet   Oral   Take 1 tablet (50 mcg total) by mouth daily.   30 each   3     Dispense as written.    Due for labs   . Vilazodone HCl (VIIBRYD) 40 MG TABS   Oral   Take by mouth.             BP 121/86  Pulse 66  Temp(Src) 97.9 F (36.6 C) (Oral)  Resp 20  SpO2 100%  LMP 09/17/2011  Physical Exam  Nursing note and vitals reviewed. Constitutional: She is oriented to person,  place, and time. She appears well-developed and well-nourished. No distress.  HENT:  Head: Normocephalic and atraumatic.  Mouth/Throat: Oropharynx is clear and moist.  Eyes: Conjunctivae and EOM are normal. Pupils are equal, round, and reactive to light.  Neck: Normal range of motion. Neck supple.  Cardiovascular: Normal rate, regular rhythm and intact distal pulses.   No murmur heard. Pulmonary/Chest: Effort normal and breath sounds normal. No respiratory distress. She has no wheezes. She has no rales.  Abdominal: Soft. She exhibits no distension. There is no tenderness. There is no rebound and no guarding.  Musculoskeletal: Normal range of motion. She exhibits no edema and no tenderness.  Neurological: She is alert and oriented to person, place, and time.  Skin: Skin is warm and dry. No rash noted. No erythema.  Psychiatric: She has a normal mood and affect. Her behavior is normal.    ED Course  Procedures (including critical care time)  Labs  Reviewed  URINALYSIS, ROUTINE W REFLEX MICROSCOPIC - Abnormal; Notable for the following:    Glucose, UA >1000 (*)    All other components within normal limits  URINE MICROSCOPIC-ADD ON   No results found.   1. Diabetes       MDM   Patient diagnosed with diabetes 3 days ago and states she started Januvia to her blood sugar is not improving. She has changed her diet but called her doctor today because her blood sugar remains 2-300 and they told her she needed to have her urine checked for ketones and possibly changing her therapy. She denies any abdominal pain, cramping, nausea, vomiting.  Patient is well appearing with normal vital signs. She has no signs or symptoms concerning for DKA. The patient has not given Januvia adequate time to work and she's only been taking it for 3 days. Urine is within normal limits. Discussed with patient it takes at least 1-2 months to see if the medication will work appropriately. She will follow up with her doctor tomorrow        Gwyneth Sprout, MD 05/14/13 2217

## 2013-05-14 NOTE — Telephone Encounter (Signed)
Per Dr. Lenord Fellers, patient is to start Lantus 5 u qhs. However, when i spoke to her regarding this, she declined. She states, "once you start on Insulin, you never get off". Wants to know if another oral agent can be added to her regimen.

## 2013-05-15 ENCOUNTER — Other Ambulatory Visit (INDEPENDENT_AMBULATORY_CARE_PROVIDER_SITE_OTHER): Payer: 59 | Admitting: Internal Medicine

## 2013-05-15 LAB — KETONES, QUALITATIVE: Acetone, Bld: NEGATIVE

## 2013-05-15 MED ORDER — METFORMIN HCL 500 MG PO TABS: 500.0000 mg | ORAL_TABLET | Freq: Two times a day (BID) | ORAL | Status: DC

## 2013-05-15 NOTE — Patient Instructions (Signed)
Results of stat serum acetone are negative. Patient informed. OK per verbal order of Dr. Lenord Fellers for Metformin 500mg  po bid in addition to the Januvia. We will get appointment with endocrinologist as soon as possible.

## 2013-05-18 NOTE — Patient Instructions (Addendum)
Keep Accu-Cheks before meals 3 times a day. Start Januvia 100 mg daily. Call with Accu-Chek results in a couple of days or sooner if necessary. Guidelines given for normal Accu-Cheks.

## 2013-05-18 NOTE — Progress Notes (Signed)
  Subjective:    Patient ID: Nancy Camacho, female    DOB: 1961-08-07, 52 y.o.   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 Patient tells me that she's lost 20 pounds since February 2014. Apparently had frequent urination and thirst. Did not put these together as possibly being diabetes symptoms although her father had history of diabetes. Has felt fatigued. Was placed with weight loss as her weight is always concerning to her and she's tried various preparations to lose weight. At one point she was being seen at [facility]. Apparently had obesity in childhood years. She had lab work on June 13 showing glucose of 270. Today urine dipstick shows 4+ glucose and small ketones.   Review of Systems     Objective:   Physical Exam not examined today. Spent 30 minutes talking with her about the diagnoses of type 2 diabetes. I do not think she is acidotic. I think we can start her on Januvia 100 mg daily. She is adamant about not being placed on insulin. We will refer her to a dietitian for diabetic diet counseling. We will also see that she is managed by an endocrinologist.        Assessment & Plan:  New onset type 2 diabetes mellitus  Vision changes secondary to diabetes mellitus  Plan: Januvia 100 mg daily. She is to call me in a few days with Accu-Chek readings. Have given her prescription for home glucose monitor diabetic test strips and lancets. Suggested she take Accu-Cheks before meals 3 times a day. She frequently does not eat supper until late evening around 9 PM or so due to work schedule. She may have to change her mealtimes with this diagnosis.

## 2013-05-21 ENCOUNTER — Encounter: Payer: 59 | Attending: Internal Medicine | Admitting: Dietician

## 2013-05-21 ENCOUNTER — Encounter: Payer: Self-pay | Admitting: Dietician

## 2013-05-21 VITALS — Ht 67.5 in | Wt 170.8 lb

## 2013-05-21 DIAGNOSIS — E119 Type 2 diabetes mellitus without complications: Secondary | ICD-10-CM | POA: Insufficient documentation

## 2013-05-21 DIAGNOSIS — Z713 Dietary counseling and surveillance: Secondary | ICD-10-CM | POA: Insufficient documentation

## 2013-05-21 NOTE — Progress Notes (Signed)
Medical Nutrition Therapy:  Appt start time: 1200 end time:  1300.  Assessment:  Primary concerns today: type II DM.   MEDICATIONS: see list  Pt usually works 9-12 and 2-9 M, Tu. 10-5 on W, 10-7 Fri, Thurs 10-7, Sat 1-6. Every other Sunday works.    DIETARY INTAKE:  Usual eating pattern includes 3 meals and 0-1 snacks per day.  Everyday foods include fat free canadian bacon.  Avoided foods include sugar-containing beverages.    24-hr recall:  B ( AM): canadian bacon, one egg, oatmeal or toast, coffee with fat free creamer and truvia  Snk ( AM): none  L ( PM): sandwich or salad with salad dressing with added vinegar Snk ( PM): none D ( PM): various combinations of starch, meat, veg; sometimes just a glucerna shake and veg or english muffin with almond butter Snk ( PM): usually none Beverages: water, lemon water, propel zero, diet grape juice, coffee with truvia and fat free creamer, unsweetened tea  Pt claims to enjoy bars and drinks to assist in meal replacement, as she often struggles to find the time away from work to eat a good meal.  Usual physical activity: pt states little to no activity outside of work. In particular recently she has not been doing much due to family medical issues and long work hours. Has cycled in past, and rides horseback sometimes. Typically on feet for work.   Progress Towards Goal(s):  In progress.   Nutritional Diagnosis:  NI-5.8.4 Inconsistent carbohydrate intake As related to diabetes mellitus type II.  As evidenced by alternating low and high CHO concentration of meals, per pt report and diet recall.    Intervention:  Nutrition counseling provided regarding pathophysiology of type II DM, including lab testing, best times to check BG at home, CHO counting, and importance of physical activity in treatment of hyperglycemia. RD also prescribed CHo controlled diet plan for pt to include 3 meals and one optional snack as below: B: 3 CHO, 3-4 Pro, 2 fat L:  4 CHO, 3-4 Pro, 1+ Veg, 2 fat D: 3 CHO, 3-4 Pro, 1+ Veg, 3 fat Snack: 2 CHO, 2 fat, 0-3 Pro  RD also advised pt on high protein replacement drinks, particularly Premier Protein and Optimum Nutrition Whey protein powder as low CHO alternatives to Glucerna (as pt does not enjoy the taste). Pt also wanted referral to endocrinologist. RD recommended contacting Dr. Ernest Haber (provided her card), and recommended to obtain referral to an endocrinologist of PCP choice if not contacting her office directly.  Handouts given during visit include:  Diabetes Basics handbook  CHO controlled diet plan  Monitoring/Evaluation:  Dietary intake, exercise, portion control, and body weight in 6 week(s).

## 2013-05-26 ENCOUNTER — Ambulatory Visit: Payer: 59 | Admitting: Internal Medicine

## 2013-06-08 ENCOUNTER — Other Ambulatory Visit: Payer: Self-pay | Admitting: Internal Medicine

## 2013-06-30 ENCOUNTER — Other Ambulatory Visit: Payer: Self-pay | Admitting: Internal Medicine

## 2013-06-30 NOTE — Telephone Encounter (Signed)
Did she she endocrinologist?

## 2013-07-06 ENCOUNTER — Ambulatory Visit: Payer: 59 | Admitting: Dietician

## 2013-07-23 ENCOUNTER — Encounter: Payer: Self-pay | Admitting: Internal Medicine

## 2013-09-07 ENCOUNTER — Other Ambulatory Visit: Payer: 59 | Admitting: Internal Medicine

## 2013-09-10 ENCOUNTER — Encounter: Payer: 59 | Admitting: Internal Medicine

## 2013-10-10 ENCOUNTER — Encounter (HOSPITAL_BASED_OUTPATIENT_CLINIC_OR_DEPARTMENT_OTHER): Payer: Self-pay | Admitting: Emergency Medicine

## 2013-10-10 ENCOUNTER — Emergency Department (HOSPITAL_BASED_OUTPATIENT_CLINIC_OR_DEPARTMENT_OTHER): Payer: 59

## 2013-10-10 ENCOUNTER — Emergency Department (HOSPITAL_BASED_OUTPATIENT_CLINIC_OR_DEPARTMENT_OTHER)
Admission: EM | Admit: 2013-10-10 | Discharge: 2013-10-10 | Disposition: A | Discharge status: Home / Self Care | Payer: 59 | Attending: Emergency Medicine | Admitting: Emergency Medicine

## 2013-10-10 DIAGNOSIS — Q790 Congenital diaphragmatic hernia: Secondary | ICD-10-CM

## 2013-10-10 DIAGNOSIS — E079 Disorder of thyroid, unspecified: Secondary | ICD-10-CM | POA: Insufficient documentation

## 2013-10-10 DIAGNOSIS — K449 Diaphragmatic hernia without obstruction or gangrene: Secondary | ICD-10-CM | POA: Insufficient documentation

## 2013-10-10 DIAGNOSIS — F3289 Other specified depressive episodes: Secondary | ICD-10-CM | POA: Insufficient documentation

## 2013-10-10 DIAGNOSIS — F329 Major depressive disorder, single episode, unspecified: Secondary | ICD-10-CM | POA: Insufficient documentation

## 2013-10-10 DIAGNOSIS — E119 Type 2 diabetes mellitus without complications: Secondary | ICD-10-CM | POA: Insufficient documentation

## 2013-10-10 DIAGNOSIS — N12 Tubulo-interstitial nephritis, not specified as acute or chronic: Secondary | ICD-10-CM | POA: Insufficient documentation

## 2013-10-10 DIAGNOSIS — Z872 Personal history of diseases of the skin and subcutaneous tissue: Secondary | ICD-10-CM | POA: Insufficient documentation

## 2013-10-10 DIAGNOSIS — Z3202 Encounter for pregnancy test, result negative: Secondary | ICD-10-CM | POA: Insufficient documentation

## 2013-10-10 DIAGNOSIS — Z8669 Personal history of other diseases of the nervous system and sense organs: Secondary | ICD-10-CM | POA: Insufficient documentation

## 2013-10-10 DIAGNOSIS — Z79899 Other long term (current) drug therapy: Secondary | ICD-10-CM | POA: Insufficient documentation

## 2013-10-10 DIAGNOSIS — G8929 Other chronic pain: Secondary | ICD-10-CM | POA: Insufficient documentation

## 2013-10-10 DIAGNOSIS — Z87891 Personal history of nicotine dependence: Secondary | ICD-10-CM | POA: Insufficient documentation

## 2013-10-10 LAB — URINALYSIS, ROUTINE W REFLEX MICROSCOPIC
Nitrite: NEGATIVE
Protein, ur: 100 mg/dL — AB
Specific Gravity, Urine: 1.011 (ref 1.005–1.030)
Urobilinogen, UA: 1 mg/dL (ref 0.0–1.0)

## 2013-10-10 LAB — PREGNANCY, URINE: Preg Test, Ur: NEGATIVE

## 2013-10-10 LAB — URINE MICROSCOPIC-ADD ON

## 2013-10-10 MED ORDER — LEVOFLOXACIN 750 MG PO TABS
750.0000 mg | ORAL_TABLET | Freq: Once | ORAL | Status: AC
  Administered 2013-10-10: 750 mg via ORAL
  Filled 2013-10-10: qty 1

## 2013-10-10 MED ORDER — CIPROFLOXACIN HCL 500 MG PO TABS: 500.0000 mg | ORAL_TABLET | Freq: Two times a day (BID) | ORAL | Status: DC

## 2013-10-10 MED ORDER — PHENAZOPYRIDINE HCL 200 MG PO TABS: 200.0000 mg | ORAL_TABLET | Freq: Three times a day (TID) | ORAL | Status: DC

## 2013-10-10 MED ORDER — LEVOFLOXACIN 500 MG PO TABS: 500.0000 mg | ORAL_TABLET | Freq: Once | ORAL | Status: DC

## 2013-10-10 MED ORDER — PHENAZOPYRIDINE HCL 100 MG PO TABS
200.0000 mg | ORAL_TABLET | Freq: Once | ORAL | Status: AC
  Administered 2013-10-10: 200 mg via ORAL
  Filled 2013-10-10 (×2): qty 2

## 2013-10-10 NOTE — ED Notes (Signed)
Pt reports inability to urinate since 6pm today.  Reports (R) sided back pain that started at midnight.  Denies N/V/D/fevers

## 2013-10-10 NOTE — ED Provider Notes (Signed)
CSN: [id number]     Arrival date & time 10/10/13  0059 History   First MD Initiated Contact with Patient 10/10/13 0119     Chief Complaint  Patient presents with  . Urinary Retention   (Consider location/radiation/quality/duration/timing/severity/associated sxs/prior Treatment) HPI This is a 52 year old female with difficulty emptying her bladder since 6 PM yesterday evening. She feels the urge to urinate but is only able to void small amounts. She feels as if her bladder is full. She is also having moderate right flank and right lower quadrant pain that started about 90 minutes ago. She denies fever, chills, nausea or vomiting. There are no specific mitigating or exacerbating factors.  Past Medical History  Diagnosis Date  . Depression   . Thyroid disease   . Chronic back pain   . History of seborrhea     ear  . History of Bell's palsy   . Diabetes mellitus without complication    Past Surgical History  Procedure Laterality Date  . Abdominoplasty  07/2005  . Breast enhancement surgery  04/2006  . Tubal ligation     Family History  Problem Relation Age of Onset  . Diabetes Father    History  Substance Use Topics  . Smoking status: Former Games developer  . Smokeless tobacco: Not on file  . Alcohol Use: No   OB History   Grav Para Term Preterm Abortions TAB SAB Ect Mult Living                 Review of Systems  All other systems reviewed and are negative.    Allergies  Flexeril  Home Medications   Current Outpatient Rx  Name  Route  Sig  Dispense  Refill  . ALPRAZolam (XANAX) 0.5 MG tablet      at bedtime as needed.          Marland Kitchen amphetamine-dextroamphetamine (ADDERALL XR) 25 MG 24 hr capsule   Oral   Take 25 mg by mouth every morning.         . hydrocortisone 2.5 % cream      APPLY TO RASH SPARINGLY.   30 g   1   . JANUVIA 100 MG tablet      TAKE 1 TABLET DAILY.   30 tablet   0   . liothyronine (CYTOMEL) 5 MCG tablet   Oral   Take 5 mcg by mouth  daily.         . metFORMIN (GLUCOPHAGE) 500 MG tablet      TAKE  (1)  TABLET TWICE A DAY WITH MEALS (BREAKFAST AND SUPPER)   60 tablet   0   . nabumetone (RELAFEN) 500 MG tablet   Oral   Take 1 tablet (500 mg total) by mouth 2 (two) times daily.   60 tablet   6   . SYNTHROID 50 MCG tablet   Oral   Take 1 tablet (50 mcg total) by mouth daily.   30 each   3     Dispense as written.    Due for labs   . Vilazodone HCl (VIIBRYD) 40 MG TABS   Oral   Take by mouth.            BP 145/91  Pulse 98  Temp(Src) 98 F (36.7 C) (Oral)  Resp 20  Ht 5\' 8"  (1.727 m)  Wt 158 lb (71.668 kg)  BMI 24.03 kg/m2  SpO2 98%  LMP 09/17/2011  Physical Exam General: Well-developed, well-nourished female in no acute  distress; appearance consistent with age of record HENT: normocephalic; atraumatic Eyes: pupils equal, round and reactive to light; extraocular muscles intact Neck: supple Heart: regular rate and rhythm Lungs: clear to auscultation bilaterally Abdomen: soft; nondistended; suprapubic tenderness; no masses or hepatosplenomegaly; bowel sounds present GU: Right CVA tenderness Extremities: No deformity; full range of motion Neurologic: Awake, alert and oriented; motor function intact in all extremities and symmetric; no facial droop Skin: Warm and dry Psychiatric: Normal mood and affect    ED Course  Procedures (including critical care time)      MDM   Nursing notes and vitals signs, including pulse oximetry, reviewed.  Summary of this visit's results, reviewed by myself:  Labs:  Results for orders placed during the hospital encounter of 10/10/13 (from the past 24 hour(s))  URINALYSIS, ROUTINE W REFLEX MICROSCOPIC     Status: Abnormal   Collection Time    10/10/13  1:10 AM      Result Value Range   Color, Urine YELLOW  YELLOW   APPearance TURBID (*) CLEAR   Specific Gravity, Urine 1.011  1.005 - 1.030   pH 7.0  5.0 - 8.0   Glucose, UA NEGATIVE  NEGATIVE  mg/dL   Hgb urine dipstick LARGE (*) NEGATIVE   Bilirubin Urine NEGATIVE  NEGATIVE   Ketones, ur NEGATIVE  NEGATIVE mg/dL   Protein, ur 454 (*) NEGATIVE mg/dL   Urobilinogen, UA 1.0  0.0 - 1.0 mg/dL   Nitrite NEGATIVE  NEGATIVE   Leukocytes, UA LARGE (*) NEGATIVE  PREGNANCY, URINE     Status: None   Collection Time    10/10/13  1:10 AM      Result Value Range   Preg Test, Ur NEGATIVE  NEGATIVE  URINE MICROSCOPIC-ADD ON     Status: Abnormal   Collection Time    10/10/13  1:10 AM      Result Value Range   Squamous Epithelial / LPF RARE  RARE   WBC, UA TOO NUMEROUS TO COUNT  <3 WBC/hpf   RBC / HPF TOO NUMEROUS TO COUNT  <3 RBC/hpf   Bacteria, UA MANY (*) RARE    Imaging Studies: Ct Abdomen Pelvis Wo Contrast  10/10/2013   CLINICAL DATA:  Right-sided back pain, flank pain  EXAM: CT ABDOMEN AND PELVIS WITHOUT CONTRAST  TECHNIQUE: Multidetector CT imaging of the abdomen and pelvis was performed following the standard protocol without intravenous contrast.  COMPARISON:  Prior radiograph from 10/23/2010  FINDINGS: The visualized lung bases are clear. Large fat containing Bochdalek's hernia is noted on the right. Bilateral breast implants are partially visualized.  The liver demonstrates a normal unenhanced appearance. The gallbladder, spleen, adrenal glands, and pancreas demonstrate a normal unenhanced appearance.  The kidneys are symmetric in size without evidence of nephrolithiasis or hydronephrosis. No stones are seen along the course of either renal collecting system.  There is no evidence of bowel obstruction. Appendix is well visualized within the right lower quadrant and is of normal caliber and appearance without associated inflammatory changes to suggest acute appendicitis. Metallic clip is noted posterior to the right hepatic lobe.  There is mild circumferential bladder wall thickening. While this may be related to incomplete distension, possible cystitis could also have this appearance.  Uterus and ovaries are within normal limits.  No free air or fluid. No pathologically enlarged intra-abdominal pelvic lymph nodes.  Osseous structures are within normal limits.  IMPRESSION: 1. No CT evidence of nephrolithiasis or obstructive uropathy. 2. Mild diffuse circumferential bladder wall  thickening. While this finding may be related to incomplete distension, possible cystitis could also have this appearance. Correlation with urinalysis recommended. 3. Normal appendix. 4. Fat containing right-sided Bochdalek hernia.   Electronically Signed   By: Rise Mu M.D.   On: 10/10/2013 02:50        Hanley Seamen, MD 10/10/13 [phone]

## 2013-10-12 LAB — URINE CULTURE

## 2013-10-16 ENCOUNTER — Encounter: Payer: 59 | Admitting: Internal Medicine

## 2013-12-16 ENCOUNTER — Other Ambulatory Visit: Payer: Self-pay | Admitting: Internal Medicine

## 2013-12-18 ENCOUNTER — Encounter: Payer: 59 | Admitting: Internal Medicine

## 2014-01-08 ENCOUNTER — Encounter: Payer: Self-pay | Admitting: Internal Medicine

## 2014-03-03 ENCOUNTER — Ambulatory Visit (AMBULATORY_SURGERY_CENTER): Payer: Self-pay

## 2014-03-03 VITALS — Ht 68.0 in | Wt 154.0 lb

## 2014-03-03 DIAGNOSIS — Z1211 Encounter for screening for malignant neoplasm of colon: Secondary | ICD-10-CM

## 2014-03-03 MED ORDER — MOVIPREP 100 G PO SOLR: 1.0000 | Freq: Once | ORAL | Status: DC

## 2014-03-08 ENCOUNTER — Encounter: Payer: Self-pay | Admitting: Internal Medicine

## 2014-03-15 ENCOUNTER — Ambulatory Visit (AMBULATORY_SURGERY_CENTER): Payer: 59 | Admitting: Internal Medicine

## 2014-03-15 ENCOUNTER — Encounter: Payer: Self-pay | Admitting: Internal Medicine

## 2014-03-15 VITALS — BP 103/63 | HR 52 | Temp 97.7°F | Resp 19 | Ht 68.0 in | Wt 154.0 lb

## 2014-03-15 DIAGNOSIS — D126 Benign neoplasm of colon, unspecified: Secondary | ICD-10-CM

## 2014-03-15 DIAGNOSIS — Z1211 Encounter for screening for malignant neoplasm of colon: Secondary | ICD-10-CM

## 2014-03-15 HISTORY — PX: COLONOSCOPY: SHX174

## 2014-03-15 MED ORDER — SODIUM CHLORIDE 0.9 % IV SOLN: 500.0000 mL | INTRAVENOUS | Status: DC

## 2014-03-15 NOTE — Op Note (Signed)
[facility]  [address] [place], [address]   COLONOSCOPY PROCEDURE REPORT  PATIENT: Nancy Camacho, Nancy Camacho  MR#: [id number] BIRTHDATE: [birth year] , 52  yrs. old GENDER: Female ENDOSCOPIST: Jerene Bears, MD REFERRED XB:[id number] Tisovec, M.D. PROCEDURE DATE:  03/15/2014 PROCEDURE:   Colonoscopy with snare polypectomy and Colonoscopy with cold biopsy polypectomy First Screening Colonoscopy - Avg.  risk and is 50 yrs.  old or older Yes.  Prior Negative Screening - Now for repeat screening. N/A  History of Adenoma - Now for follow-up colonoscopy & has been > or = to 3 yrs.  N/A  Polyps Removed Today? Yes. ASA CLASS:   Class III INDICATIONS:average risk screening and first colonoscopy. MEDICATIONS: MAC sedation, administered by CRNA and propofol (Diprivan) 300mg  IV  DESCRIPTION OF PROCEDURE:   After the risks benefits and alternatives of the procedure were thoroughly explained, informed consent was obtained.  A digital rectal exam revealed no rectal mass.   The LB [id number]  endoscope was introduced through the anus and advanced to the terminal ileum which was intubated for a short distance. No adverse events experienced.   The quality of the prep was Moviprep fair requiring copious irrigation and lavage. The instrument was then slowly withdrawn as the colon was fully examined.   COLON FINDINGS: Normal terminal ileum. Two sessile polyps measuring 3 and 5 mm in size were found at the cecum and hepatic flexure. Polypectomy was performed with cold forceps and using cold snare. All resections were complete and all polyp tissue was completely retrieved.   There was mild scattered diverticulosis noted in the left colon.   Mild melanosis was found throughout the entire examined colon.  Retroflexed views revealed no abnormalities. The time to cecum=4 minutes 34 seconds.  Withdrawal time=17 minutes 31 seconds.  The scope was withdrawn and the procedure  completed. COMPLICATIONS: There were no complications.  ENDOSCOPIC IMPRESSION: 1.   Two sessile polyps measuring 3 and 5 mm in size were found at the cecum and hepatic flexure; Polypectomy was performed with cold forceps and using cold snare 2.   There was mild diverticulosis noted in the left colon 3.   Mild melanosis was found throughout the entire examined colon  RECOMMENDATIONS: 1.  Await pathology results 2.  High fiber diet 3.  Timing of repeat colonoscopy will be determined by pathology findings. 4.  You will receive a letter within 1-2 weeks with the results of your biopsy as well as final recommendations.  Please call my office if you have not received a letter after 3 weeks.  eSigned:  Jerene Bears, MD 03/15/2014 8:36 AM      cc: The Patient and Domenick Gong, MD

## 2014-03-15 NOTE — Patient Instructions (Signed)
YOU HAD AN ENDOSCOPIC PROCEDURE TODAY AT THE [facility]: Refer to the procedure report that was given to you for any specific questions about what was found during the examination.  If the procedure report does not answer your questions, please call your gastroenterologist to clarify.  If you requested that your care partner not be given the details of your procedure findings, then the procedure report has been included in a sealed envelope for you to review at your convenience later.  YOU SHOULD EXPECT: Some feelings of bloating in the abdomen. Passage of more gas than usual.  Walking can help get rid of the air that was put into your GI tract during the procedure and reduce the bloating. If you had a lower endoscopy (such as a colonoscopy or flexible sigmoidoscopy) you may notice spotting of blood in your stool or on the toilet paper. If you underwent a bowel prep for your procedure, then you may not have a normal bowel movement for a few days.  DIET: Your first meal following the procedure should be a light meal and then it is ok to progress to your normal diet.  A half-sandwich or bowl of soup is an example of a good first meal.  Heavy or fried foods are harder to digest and may make you feel nauseous or bloated.  Likewise meals heavy in dairy and vegetables can cause extra gas to form and this can also increase the bloating.  Drink plenty of fluids but you should avoid alcoholic beverages for 24 hours.  ACTIVITY: Your care partner should take you home directly after the procedure.  You should plan to take it easy, moving slowly for the rest of the day.  You can resume normal activity the day after the procedure however you should NOT DRIVE or use heavy machinery for 24 hours (because of the sedation medicines used during the test).    SYMPTOMS TO REPORT IMMEDIATELY: A gastroenterologist can be reached at any hour.  During normal business hours, 8:30 AM to 5:00 PM Monday through Friday,  call [phone].  After hours and on weekends, please call the GI answering service at [phone] who will take a message and have the physician on call contact you.   Following lower endoscopy (colonoscopy or flexible sigmoidoscopy):  Excessive amounts of blood in the stool  Significant tenderness or worsening of abdominal pains  Swelling of the abdomen that is new, acute  Fever of 100F or higher    FOLLOW UP: If any biopsies were taken you will be contacted by phone or by letter within the next 1-3 weeks.  Call your gastroenterologist if you have not heard about the biopsies in 3 weeks.  Our staff will call the home number listed on your records the next business day following your procedure to check on you and address any questions or concerns that you may have at that time regarding the information given to you following your procedure. This is a courtesy call and so if there is no answer at the home number and we have not heard from you through the emergency physician on call, we will assume that you have returned to your regular daily activities without incident.  SIGNATURES/CONFIDENTIALITY: You and/or your care partner have signed paperwork which will be entered into your electronic medical record.  These signatures attest to the fact that that the information above on your After Visit Summary has been reviewed and is understood.  Full responsibility of the confidentiality   of this discharge information lies with you and/or your care-partner.  Polyp, diverticulosis, and high fiber diet information given.    Dr. Hilarie Fredrickson will advise you about recall colonoscopy after pathology reports are reviewed.

## 2014-03-15 NOTE — Progress Notes (Signed)
Report to pacu rn, vss, bbs=clear 

## 2014-03-15 NOTE — Progress Notes (Signed)
Called to room to assist during endoscopic procedure.  Patient ID and intended procedure confirmed with present staff. Received instructions for my participation in the procedure from the performing physician.  

## 2014-03-16 ENCOUNTER — Telehealth: Payer: Self-pay | Admitting: *Deleted

## 2014-03-16 NOTE — Telephone Encounter (Signed)
  Follow up Call-  Call back number 03/15/2014  Post procedure Call Back phone  # [phone]  Permission to leave phone message No     Patient questions:  Do you have a fever, pain , or abdominal swelling? no Pain Score  0 *  Have you tolerated food without any problems? yes  Have you been able to return to your normal activities? yes  Do you have any questions about your discharge instructions: Diet   no Medications  no Follow up visit  no  Do you have questions or concerns about your Care? no  Actions: * If pain score is 4 or above: No action needed, pain <4.

## 2014-03-29 ENCOUNTER — Encounter: Payer: Self-pay | Admitting: Internal Medicine

## 2014-11-10 ENCOUNTER — Encounter: Payer: Self-pay | Admitting: Internal Medicine

## 2015-08-29 ENCOUNTER — Other Ambulatory Visit: Payer: Self-pay | Admitting: Obstetrics and Gynecology

## 2015-08-31 LAB — CYTOLOGY - PAP

## 2017-01-18 ENCOUNTER — Ambulatory Visit (INDEPENDENT_AMBULATORY_CARE_PROVIDER_SITE_OTHER): Payer: 59 | Admitting: Psychology

## 2017-01-18 DIAGNOSIS — F4323 Adjustment disorder with mixed anxiety and depressed mood: Secondary | ICD-10-CM | POA: Diagnosis not present

## 2017-01-24 DIAGNOSIS — Z6821 Body mass index (BMI) 21.0-21.9, adult: Secondary | ICD-10-CM | POA: Diagnosis not present

## 2017-01-24 DIAGNOSIS — E119 Type 2 diabetes mellitus without complications: Secondary | ICD-10-CM | POA: Diagnosis not present

## 2017-02-15 ENCOUNTER — Ambulatory Visit (INDEPENDENT_AMBULATORY_CARE_PROVIDER_SITE_OTHER): Payer: 59 | Admitting: Psychology

## 2017-02-15 DIAGNOSIS — F4323 Adjustment disorder with mixed anxiety and depressed mood: Secondary | ICD-10-CM

## 2017-03-19 DIAGNOSIS — E038 Other specified hypothyroidism: Secondary | ICD-10-CM | POA: Diagnosis not present

## 2017-03-19 DIAGNOSIS — E119 Type 2 diabetes mellitus without complications: Secondary | ICD-10-CM | POA: Diagnosis not present

## 2017-03-19 DIAGNOSIS — Z1389 Encounter for screening for other disorder: Secondary | ICD-10-CM | POA: Diagnosis not present

## 2017-03-19 DIAGNOSIS — E784 Other hyperlipidemia: Secondary | ICD-10-CM | POA: Diagnosis not present

## 2017-03-19 DIAGNOSIS — E048 Other specified nontoxic goiter: Secondary | ICD-10-CM | POA: Diagnosis not present

## 2017-03-29 ENCOUNTER — Ambulatory Visit (INDEPENDENT_AMBULATORY_CARE_PROVIDER_SITE_OTHER): Payer: 59 | Admitting: Psychology

## 2017-03-29 DIAGNOSIS — F4323 Adjustment disorder with mixed anxiety and depressed mood: Secondary | ICD-10-CM | POA: Diagnosis not present

## 2017-05-01 DIAGNOSIS — Z6820 Body mass index (BMI) 20.0-20.9, adult: Secondary | ICD-10-CM | POA: Diagnosis not present

## 2017-05-01 DIAGNOSIS — E119 Type 2 diabetes mellitus without complications: Secondary | ICD-10-CM | POA: Diagnosis not present

## 2017-05-24 ENCOUNTER — Ambulatory Visit (INDEPENDENT_AMBULATORY_CARE_PROVIDER_SITE_OTHER): Payer: 59 | Admitting: Psychology

## 2017-05-24 DIAGNOSIS — F4323 Adjustment disorder with mixed anxiety and depressed mood: Secondary | ICD-10-CM | POA: Diagnosis not present

## 2017-06-12 DIAGNOSIS — R8299 Other abnormal findings in urine: Secondary | ICD-10-CM | POA: Diagnosis not present

## 2017-06-12 DIAGNOSIS — R81 Glycosuria: Secondary | ICD-10-CM | POA: Diagnosis not present

## 2017-06-12 DIAGNOSIS — K644 Residual hemorrhoidal skin tags: Secondary | ICD-10-CM | POA: Diagnosis not present

## 2017-06-12 DIAGNOSIS — R300 Dysuria: Secondary | ICD-10-CM | POA: Diagnosis not present

## 2017-06-26 DIAGNOSIS — Z6820 Body mass index (BMI) 20.0-20.9, adult: Secondary | ICD-10-CM | POA: Diagnosis not present

## 2017-06-26 DIAGNOSIS — R233 Spontaneous ecchymoses: Secondary | ICD-10-CM | POA: Diagnosis not present

## 2017-06-26 DIAGNOSIS — E119 Type 2 diabetes mellitus without complications: Secondary | ICD-10-CM | POA: Diagnosis not present

## 2017-07-19 ENCOUNTER — Ambulatory Visit (INDEPENDENT_AMBULATORY_CARE_PROVIDER_SITE_OTHER): Payer: 59 | Admitting: Psychology

## 2017-07-19 DIAGNOSIS — F4323 Adjustment disorder with mixed anxiety and depressed mood: Secondary | ICD-10-CM

## 2017-08-05 DIAGNOSIS — Z23 Encounter for immunization: Secondary | ICD-10-CM | POA: Diagnosis not present

## 2017-08-05 DIAGNOSIS — E038 Other specified hypothyroidism: Secondary | ICD-10-CM | POA: Diagnosis not present

## 2017-08-05 DIAGNOSIS — E119 Type 2 diabetes mellitus without complications: Secondary | ICD-10-CM | POA: Diagnosis not present

## 2017-08-07 DIAGNOSIS — E119 Type 2 diabetes mellitus without complications: Secondary | ICD-10-CM | POA: Diagnosis not present

## 2017-08-30 ENCOUNTER — Ambulatory Visit (INDEPENDENT_AMBULATORY_CARE_PROVIDER_SITE_OTHER): Payer: 59 | Admitting: Psychology

## 2017-08-30 DIAGNOSIS — F4323 Adjustment disorder with mixed anxiety and depressed mood: Secondary | ICD-10-CM | POA: Diagnosis not present

## 2017-09-04 DIAGNOSIS — Z01419 Encounter for gynecological examination (general) (routine) without abnormal findings: Secondary | ICD-10-CM | POA: Diagnosis not present

## 2017-09-04 DIAGNOSIS — Z6820 Body mass index (BMI) 20.0-20.9, adult: Secondary | ICD-10-CM | POA: Diagnosis not present

## 2017-09-18 DIAGNOSIS — Z794 Long term (current) use of insulin: Secondary | ICD-10-CM | POA: Diagnosis not present

## 2017-09-18 DIAGNOSIS — E119 Type 2 diabetes mellitus without complications: Secondary | ICD-10-CM | POA: Diagnosis not present

## 2017-10-25 ENCOUNTER — Ambulatory Visit: Payer: 59 | Admitting: Psychology

## 2017-10-25 DIAGNOSIS — F4323 Adjustment disorder with mixed anxiety and depressed mood: Secondary | ICD-10-CM | POA: Diagnosis not present

## 2017-10-31 DIAGNOSIS — Z794 Long term (current) use of insulin: Secondary | ICD-10-CM | POA: Diagnosis not present

## 2017-10-31 DIAGNOSIS — Z6820 Body mass index (BMI) 20.0-20.9, adult: Secondary | ICD-10-CM | POA: Diagnosis not present

## 2017-10-31 DIAGNOSIS — E119 Type 2 diabetes mellitus without complications: Secondary | ICD-10-CM | POA: Diagnosis not present

## 2017-11-01 DIAGNOSIS — Z803 Family history of malignant neoplasm of breast: Secondary | ICD-10-CM | POA: Diagnosis not present

## 2017-11-01 DIAGNOSIS — Z801 Family history of malignant neoplasm of trachea, bronchus and lung: Secondary | ICD-10-CM | POA: Diagnosis not present

## 2017-11-01 DIAGNOSIS — Z86010 Personal history of colonic polyps: Secondary | ICD-10-CM | POA: Diagnosis not present

## 2017-12-11 DIAGNOSIS — N390 Urinary tract infection, site not specified: Secondary | ICD-10-CM | POA: Diagnosis not present

## 2017-12-11 DIAGNOSIS — R3914 Feeling of incomplete bladder emptying: Secondary | ICD-10-CM | POA: Diagnosis not present

## 2017-12-18 DIAGNOSIS — E119 Type 2 diabetes mellitus without complications: Secondary | ICD-10-CM | POA: Diagnosis not present

## 2017-12-20 ENCOUNTER — Ambulatory Visit: Payer: 59 | Admitting: Psychology

## 2017-12-20 DIAGNOSIS — F411 Generalized anxiety disorder: Secondary | ICD-10-CM

## 2018-01-28 DIAGNOSIS — Z794 Long term (current) use of insulin: Secondary | ICD-10-CM | POA: Diagnosis not present

## 2018-01-28 DIAGNOSIS — Z6821 Body mass index (BMI) 21.0-21.9, adult: Secondary | ICD-10-CM | POA: Diagnosis not present

## 2018-01-28 DIAGNOSIS — E119 Type 2 diabetes mellitus without complications: Secondary | ICD-10-CM | POA: Diagnosis not present

## 2018-02-03 DIAGNOSIS — E038 Other specified hypothyroidism: Secondary | ICD-10-CM | POA: Diagnosis not present

## 2018-02-03 DIAGNOSIS — E119 Type 2 diabetes mellitus without complications: Secondary | ICD-10-CM | POA: Diagnosis not present

## 2018-02-03 DIAGNOSIS — R82998 Other abnormal findings in urine: Secondary | ICD-10-CM | POA: Diagnosis not present

## 2018-02-07 ENCOUNTER — Ambulatory Visit: Payer: 59 | Admitting: Psychology

## 2018-02-10 DIAGNOSIS — Z0000 Encounter for general adult medical examination without abnormal findings: Secondary | ICD-10-CM | POA: Diagnosis not present

## 2018-02-10 DIAGNOSIS — E119 Type 2 diabetes mellitus without complications: Secondary | ICD-10-CM | POA: Diagnosis not present

## 2018-02-10 DIAGNOSIS — D126 Benign neoplasm of colon, unspecified: Secondary | ICD-10-CM | POA: Diagnosis not present

## 2018-02-10 DIAGNOSIS — Z1389 Encounter for screening for other disorder: Secondary | ICD-10-CM | POA: Diagnosis not present

## 2018-02-10 DIAGNOSIS — Z794 Long term (current) use of insulin: Secondary | ICD-10-CM | POA: Diagnosis not present

## 2018-02-13 DIAGNOSIS — Z1212 Encounter for screening for malignant neoplasm of rectum: Secondary | ICD-10-CM | POA: Diagnosis not present

## 2018-04-29 DIAGNOSIS — E119 Type 2 diabetes mellitus without complications: Secondary | ICD-10-CM | POA: Diagnosis not present

## 2018-04-29 DIAGNOSIS — R109 Unspecified abdominal pain: Secondary | ICD-10-CM | POA: Diagnosis not present

## 2018-04-29 DIAGNOSIS — Z794 Long term (current) use of insulin: Secondary | ICD-10-CM | POA: Diagnosis not present

## 2018-05-08 DIAGNOSIS — Z23 Encounter for immunization: Secondary | ICD-10-CM | POA: Diagnosis not present

## 2018-06-16 DIAGNOSIS — Z794 Long term (current) use of insulin: Secondary | ICD-10-CM | POA: Diagnosis not present

## 2018-06-16 DIAGNOSIS — D126 Benign neoplasm of colon, unspecified: Secondary | ICD-10-CM | POA: Diagnosis not present

## 2018-06-16 DIAGNOSIS — E119 Type 2 diabetes mellitus without complications: Secondary | ICD-10-CM | POA: Diagnosis not present

## 2018-07-11 DIAGNOSIS — Z23 Encounter for immunization: Secondary | ICD-10-CM | POA: Diagnosis not present

## 2018-07-25 ENCOUNTER — Ambulatory Visit: Payer: 59 | Admitting: Psychology

## 2018-07-25 DIAGNOSIS — F411 Generalized anxiety disorder: Secondary | ICD-10-CM

## 2018-08-06 DIAGNOSIS — E119 Type 2 diabetes mellitus without complications: Secondary | ICD-10-CM | POA: Diagnosis not present

## 2018-08-06 DIAGNOSIS — B373 Candidiasis of vulva and vagina: Secondary | ICD-10-CM | POA: Diagnosis not present

## 2018-08-06 DIAGNOSIS — Z794 Long term (current) use of insulin: Secondary | ICD-10-CM | POA: Diagnosis not present

## 2018-08-22 DIAGNOSIS — E119 Type 2 diabetes mellitus without complications: Secondary | ICD-10-CM | POA: Diagnosis not present

## 2018-09-19 ENCOUNTER — Ambulatory Visit: Payer: 59 | Admitting: Psychology

## 2018-09-22 DIAGNOSIS — Z01419 Encounter for gynecological examination (general) (routine) without abnormal findings: Secondary | ICD-10-CM | POA: Diagnosis not present

## 2018-09-22 DIAGNOSIS — Z6823 Body mass index (BMI) 23.0-23.9, adult: Secondary | ICD-10-CM | POA: Diagnosis not present

## 2018-10-09 DIAGNOSIS — M79645 Pain in left finger(s): Secondary | ICD-10-CM | POA: Diagnosis not present

## 2018-10-09 DIAGNOSIS — M545 Low back pain: Secondary | ICD-10-CM | POA: Diagnosis not present

## 2018-10-09 DIAGNOSIS — M79644 Pain in right finger(s): Secondary | ICD-10-CM | POA: Diagnosis not present

## 2018-10-17 ENCOUNTER — Ambulatory Visit: Payer: 59 | Admitting: Psychology

## 2018-10-17 DIAGNOSIS — F411 Generalized anxiety disorder: Secondary | ICD-10-CM

## 2018-10-27 DIAGNOSIS — M859 Disorder of bone density and structure, unspecified: Secondary | ICD-10-CM | POA: Diagnosis not present

## 2018-10-27 DIAGNOSIS — E119 Type 2 diabetes mellitus without complications: Secondary | ICD-10-CM | POA: Diagnosis not present

## 2018-11-13 DIAGNOSIS — Z6823 Body mass index (BMI) 23.0-23.9, adult: Secondary | ICD-10-CM | POA: Diagnosis not present

## 2018-11-13 DIAGNOSIS — Z794 Long term (current) use of insulin: Secondary | ICD-10-CM | POA: Diagnosis not present

## 2018-11-13 DIAGNOSIS — E119 Type 2 diabetes mellitus without complications: Secondary | ICD-10-CM | POA: Diagnosis not present

## 2018-11-14 ENCOUNTER — Ambulatory Visit: Payer: 59 | Admitting: Psychology

## 2018-11-28 DIAGNOSIS — Z1231 Encounter for screening mammogram for malignant neoplasm of breast: Secondary | ICD-10-CM | POA: Diagnosis not present

## 2018-11-28 DIAGNOSIS — Z8262 Family history of osteoporosis: Secondary | ICD-10-CM | POA: Diagnosis not present

## 2018-11-28 DIAGNOSIS — M8589 Other specified disorders of bone density and structure, multiple sites: Secondary | ICD-10-CM | POA: Diagnosis not present

## 2018-12-19 ENCOUNTER — Ambulatory Visit: Payer: 59 | Admitting: Psychology

## 2018-12-19 DIAGNOSIS — F411 Generalized anxiety disorder: Secondary | ICD-10-CM | POA: Diagnosis not present

## 2018-12-23 DIAGNOSIS — R109 Unspecified abdominal pain: Secondary | ICD-10-CM | POA: Diagnosis not present

## 2019-01-04 ENCOUNTER — Encounter (HOSPITAL_BASED_OUTPATIENT_CLINIC_OR_DEPARTMENT_OTHER): Payer: Self-pay | Admitting: Emergency Medicine

## 2019-01-04 ENCOUNTER — Other Ambulatory Visit: Payer: Self-pay

## 2019-01-04 ENCOUNTER — Emergency Department (HOSPITAL_BASED_OUTPATIENT_CLINIC_OR_DEPARTMENT_OTHER)
Admission: EM | Admit: 2019-01-04 | Discharge: 2019-01-04 | Disposition: A | Discharge status: Home / Self Care | Payer: 59 | Attending: Emergency Medicine | Admitting: Emergency Medicine

## 2019-01-04 DIAGNOSIS — E119 Type 2 diabetes mellitus without complications: Secondary | ICD-10-CM | POA: Diagnosis not present

## 2019-01-04 DIAGNOSIS — R112 Nausea with vomiting, unspecified: Secondary | ICD-10-CM | POA: Diagnosis not present

## 2019-01-04 DIAGNOSIS — Z87891 Personal history of nicotine dependence: Secondary | ICD-10-CM | POA: Insufficient documentation

## 2019-01-04 DIAGNOSIS — R1010 Upper abdominal pain, unspecified: Secondary | ICD-10-CM | POA: Diagnosis present

## 2019-01-04 DIAGNOSIS — Z7984 Long term (current) use of oral hypoglycemic drugs: Secondary | ICD-10-CM | POA: Diagnosis not present

## 2019-01-04 DIAGNOSIS — Z79899 Other long term (current) drug therapy: Secondary | ICD-10-CM | POA: Diagnosis not present

## 2019-01-04 DIAGNOSIS — R109 Unspecified abdominal pain: Secondary | ICD-10-CM | POA: Diagnosis not present

## 2019-01-04 LAB — CBC WITH DIFFERENTIAL/PLATELET
Abs Immature Granulocytes: 0.02 10*3/uL (ref 0.00–0.07)
BASOS ABS: 0.1 10*3/uL (ref 0.0–0.1)
Basophils Relative: 1 %
Eosinophils Absolute: 0.2 10*3/uL (ref 0.0–0.5)
Eosinophils Relative: 3 %
HCT: 43.2 % (ref 36.0–46.0)
Hemoglobin: 13.8 g/dL (ref 12.0–15.0)
Immature Granulocytes: 0 %
Lymphocytes Relative: 28 %
Lymphs Abs: 1.7 10*3/uL (ref 0.7–4.0)
MCH: 30.5 pg (ref 26.0–34.0)
MCHC: 31.9 g/dL (ref 30.0–36.0)
MCV: 95.6 fL (ref 80.0–100.0)
Monocytes Absolute: 0.4 10*3/uL (ref 0.1–1.0)
Monocytes Relative: 6 %
NRBC: 0 % (ref 0.0–0.2)
Neutro Abs: 3.6 10*3/uL (ref 1.7–7.7)
Neutrophils Relative %: 62 %
Platelets: 408 10*3/uL — ABNORMAL HIGH (ref 150–400)
RBC: 4.52 MIL/uL (ref 3.87–5.11)
RDW: 13.2 % (ref 11.5–15.5)
WBC: 5.9 10*3/uL (ref 4.0–10.5)

## 2019-01-04 LAB — URINALYSIS, MICROSCOPIC (REFLEX): RBC / HPF: NONE SEEN RBC/hpf (ref 0–5)

## 2019-01-04 LAB — COMPREHENSIVE METABOLIC PANEL
ALT: 20 U/L (ref 0–44)
AST: 22 U/L (ref 15–41)
Albumin: 4 g/dL (ref 3.5–5.0)
Alkaline Phosphatase: 45 U/L (ref 38–126)
Anion gap: 8 (ref 5–15)
BUN: 20 mg/dL (ref 6–20)
CHLORIDE: 105 mmol/L (ref 98–111)
CO2: 26 mmol/L (ref 22–32)
Calcium: 8.8 mg/dL — ABNORMAL LOW (ref 8.9–10.3)
Creatinine, Ser: 0.62 mg/dL (ref 0.44–1.00)
GFR calc Af Amer: >60 mL/min (ref >60)
GFR calc non Af Amer: >60 mL/min (ref >60)
Glucose, Bld: 130 mg/dL — ABNORMAL HIGH (ref 70–99)
Potassium: 4.1 mmol/L (ref 3.5–5.1)
SODIUM: 139 mmol/L (ref 135–145)
Total Bilirubin: 1.1 mg/dL (ref 0.3–1.2)
Total Protein: 6.7 g/dL (ref 6.5–8.1)

## 2019-01-04 LAB — URINALYSIS, ROUTINE W REFLEX MICROSCOPIC
Bilirubin Urine: NEGATIVE
Glucose, UA: >=500 mg/dL — AB
Hgb urine dipstick: NEGATIVE
Ketones, ur: 15 mg/dL — AB
Leukocytes, UA: NEGATIVE
Nitrite: NEGATIVE
Protein, ur: NEGATIVE mg/dL
SPECIFIC GRAVITY, URINE: 1.015 (ref 1.005–1.030)
pH: 5.5 (ref 5.0–8.0)

## 2019-01-04 MED ORDER — METOCLOPRAMIDE HCL 10 MG PO TABS: 10.0000 mg | ORAL_TABLET | Freq: Four times a day (QID) | ORAL | 0 refills | Status: DC

## 2019-01-04 MED ORDER — SODIUM CHLORIDE 0.9 % IV BOLUS
1000.0000 mL | Freq: Once | INTRAVENOUS | Status: AC
  Administered 2019-01-04: 1000 mL via INTRAVENOUS

## 2019-01-04 MED ORDER — METOCLOPRAMIDE HCL 5 MG/ML IJ SOLN
10.0000 mg | Freq: Once | INTRAMUSCULAR | Status: AC
  Administered 2019-01-04: 10 mg via INTRAVENOUS
  Filled 2019-01-04: qty 2

## 2019-01-04 NOTE — ED Triage Notes (Signed)
Pt c/o intermittent abdominal pain with vomiting since January 20th.

## 2019-01-04 NOTE — ED Provider Notes (Signed)
[facility] EMERGENCY DEPARTMENT Provider Note   CSN: [id number] Arrival date & time: 01/04/19  8242     History   Chief Complaint Chief Complaint  Patient presents with  . Abdominal Pain  . Emesis    HPI Nancy Camacho is a 58 y.o. female.  HPI  58 year old female comes in with chief complaint of abdominal discomfort and vomiting.  Patient has history of diabetes, she denies any history of abdominal surgeries.  According to the patient she has been having off-and-on abdominal pain for the last 3 weeks.  However over the last 3 days she has had daily pain.  Her pain is typically present in the morning and described as upper quadrant abdominal pain with associated nausea and vomiting.  Patient states that she had emesis x3 or 4 earlier today, with the last emesis being bilious in nature.  She denies any diarrhea.  Patient has no history of similar pain in the past.  Past Medical History:  Diagnosis Date  . Chronic back pain   . Depression   . Diabetes mellitus without complication ([place])   . History of Bell's palsy   . History of seborrhea    ear  . Thyroid disease     Patient Active Problem List   Diagnosis Date Noted  . Type II or unspecified type diabetes mellitus without mention of complication, uncontrolled 05/11/2013  . Depression 09/30/2011  . Menopause 09/30/2011  . Vitamin D deficiency 09/30/2011  . Back pain 09/30/2011    Past Surgical History:  Procedure Laterality Date  . ABDOMINOPLASTY  07/2005  . BREAST ENHANCEMENT SURGERY  04/2006  . TUBAL LIGATION       OB History   No obstetric history on file.      Home Medications    Prior to Admission medications   Medication Sig Start Date End Date Taking? Authorizing Provider  empagliflozin (JARDIANCE) 25 MG TABS tablet Take 25 mg by mouth daily.   Yes [provider]  ALPRAZolam Duanne Moron) 0.5 MG tablet at bedtime as needed.  08/24/12   [provider]  BIOTIN FORTE PO Take by  mouth. 2 in am    [provider]  FIBER SELECT GUMMIES PO Take by mouth. 2 at night    [provider]  HYDROcodone-acetaminophen (NORCO/VICODIN) 5-325 MG per tablet  01/05/14   [provider]  hydrocortisone 2.5 % cream APPLY TO RASH SPARINGLY. 05/16/12   Elby Showers, MD  JANUMET 50-1000 MG per tablet  02/11/14   [provider]  JANUVIA 100 MG tablet TAKE 1 TABLET DAILY. 06/30/13   Elby Showers, MD  lamoTRIgine (LAMICTAL) 25 MG tablet Take 25 mg by mouth 2 (two) times daily. 2 at night    [provider]  metFORMIN (GLUCOPHAGE) 500 MG tablet TAKE  (1)  TABLET TWICE A DAY WITH MEALS (BREAKFAST AND SUPPER) 06/30/13   Elby Showers, MD  metoCLOPramide (REGLAN) 10 MG tablet Take 1 tablet (10 mg total) by mouth every 6 (six) hours. 01/04/19   Varney Biles, MD  Multiple Vitamins-Minerals (MULTIVITAMIN GUMMIES ADULTS PO) Take by mouth. 2 at night    [provider]  nabumetone (RELAFEN) 500 MG tablet Take 1 tablet (500 mg total) by mouth 2 (two) times daily. 10/20/12   Elby Showers, MD  SYNTHROID 50 MCG tablet Take 1 tablet (50 mcg total) by mouth daily. 07/29/12   Elby Showers, MD  Vortioxetine HBr (BRINTELLIX) 10 MG TABS Take  1 tablet by mouth daily.    [provider]    Family History Family History  Problem Relation Age of Onset  . Diabetes Father   . Colon cancer Neg Hx   . Pancreatic cancer Neg Hx   . Rectal cancer Neg Hx   . Stomach cancer Neg Hx     Social History Social History   Tobacco Use  . Smoking status: Former Research scientist (life sciences)  . Smokeless tobacco: Never Used  Substance Use Topics  . Alcohol use: No  . Drug use: No     Allergies   Flexeril [cyclobenzaprine]   Review of Systems Review of Systems  Constitutional: Positive for activity change.  Respiratory: Negative for shortness of breath.   Cardiovascular: Negative for chest pain.  Gastrointestinal: Positive for abdominal pain, nausea and vomiting.  Negative for diarrhea.  All other systems reviewed and are negative.    Physical Exam Updated Vital Signs BP 134/80   Pulse 87   Temp 97.7 F (36.5 C) (Oral)   Resp 18   Ht 5\' 8"  (1.727 m)   Wt 65.8 kg   LMP 09/17/2011   SpO2 100%   BMI 22.05 kg/m   Physical Exam Vitals signs and nursing note reviewed.  Constitutional:      Appearance: She is well-developed.  HENT:     Head: Normocephalic and atraumatic.  Neck:     Musculoskeletal: Normal range of motion and neck supple.  Cardiovascular:     Rate and Rhythm: Normal rate.  Pulmonary:     Effort: Pulmonary effort is normal.  Abdominal:     General: Abdomen is flat. Bowel sounds are normal.     Tenderness: There is no abdominal tenderness.  Skin:    General: Skin is warm and dry.  Neurological:     Mental Status: She is alert and oriented to person, place, and time.      ED Treatments / Results  Labs (all labs ordered are listed, but only abnormal results are displayed) Labs Reviewed  COMPREHENSIVE METABOLIC PANEL - Abnormal; Notable for the following components:      Result Value   Glucose, Bld 130 (*)    Calcium 8.8 (*)    All other components within normal limits  CBC WITH DIFFERENTIAL/PLATELET - Abnormal; Notable for the following components:   Platelets 408 (*)    All other components within normal limits  URINALYSIS, ROUTINE W REFLEX MICROSCOPIC - Abnormal; Notable for the following components:   Glucose, UA >=500 (*)    Ketones, ur 15 (*)    All other components within normal limits  URINALYSIS, MICROSCOPIC (REFLEX) - Abnormal; Notable for the following components:   Bacteria, UA RARE (*)    All other components within normal limits    EKG None  Radiology No results found.  Procedures Procedures (including critical care time)  Medications Ordered in ED Medications  sodium chloride 0.9 % bolus 1,000 mL (0 mLs Intravenous Stopped 01/04/19 1157)  metoCLOPramide (REGLAN) injection 10 mg (10 mg  Intravenous Given 01/04/19 1226)     Initial Impression / Assessment and Plan / ED Course  I have reviewed the triage vital signs and the nursing notes.  Pertinent labs & imaging results that were available during my care of the patient were reviewed by me and considered in my medical decision making (see chart for details).     58 year old female comes in with chief complaint of abdominal pain, nausea and vomiting.  She has no focal abdominal  tenderness on her exam.  It appears that she has had this abdominal pain off and on for the last 3 weeks, now it has gotten constant.  She is also having vomiting that has now resolved.  Differential diagnosis included small bowel obstruction.  Patient has no surgical history and she has been passing flatus.  Doubt that this is an acute SBO.  Other possibility includes gastroparesis and gastritis.   We will treat conservatively for now, check basic labs and reassess.  12:42 PM Patient has not had any emesis in the ER.  She has passed oral challenge.  She continues to have benign abdominal exam.  I do not think there is underlying infectious process or surgical process going on.  We will treat her with Reglan and have her follow-up with her PCP  Final Clinical Impressions(s) / ED Diagnoses   Final diagnoses:  Non-intractable vomiting with nausea, unspecified vomiting type    ED Discharge Orders         Ordered    metoCLOPramide (REGLAN) 10 MG tablet  Every 6 hours,   Status:  Discontinued     01/04/19 1224    metoCLOPramide (REGLAN) 10 MG tablet  Every 6 hours     01/04/19 [place], [place], MD 01/04/19 1243

## 2019-01-04 NOTE — Discharge Instructions (Signed)
We saw in the ER for abdominal discomfort, nausea and vomiting.  The lab work-up in the ER is reassuring.  No signs of urinary tract infection noted.  We suspect that your symptoms could be because of a condition called gastroparesis.  It is difficult for [place] to diagnose this condition in the ER, therefore we recommend that he follow-up with your primary care doctor for further evaluation and management.  Recommend clear liquid diet and soft foods for the next 2 or 3 days that are easy to digest.  You can advance to meat and solid foods if doing well.

## 2019-02-10 ENCOUNTER — Encounter: Payer: Self-pay | Admitting: Internal Medicine

## 2019-02-11 DIAGNOSIS — Z794 Long term (current) use of insulin: Secondary | ICD-10-CM | POA: Diagnosis not present

## 2019-02-11 DIAGNOSIS — E119 Type 2 diabetes mellitus without complications: Secondary | ICD-10-CM | POA: Diagnosis not present

## 2019-02-20 ENCOUNTER — Ambulatory Visit: Payer: 59 | Admitting: Psychology

## 2019-03-05 DIAGNOSIS — N9089 Other specified noninflammatory disorders of vulva and perineum: Secondary | ICD-10-CM | POA: Diagnosis not present

## 2019-03-05 DIAGNOSIS — N760 Acute vaginitis: Secondary | ICD-10-CM | POA: Diagnosis not present

## 2019-04-03 ENCOUNTER — Ambulatory Visit (INDEPENDENT_AMBULATORY_CARE_PROVIDER_SITE_OTHER): Payer: 59 | Admitting: Psychology

## 2019-04-03 DIAGNOSIS — F411 Generalized anxiety disorder: Secondary | ICD-10-CM | POA: Diagnosis not present

## 2019-04-10 DIAGNOSIS — M859 Disorder of bone density and structure, unspecified: Secondary | ICD-10-CM | POA: Diagnosis not present

## 2019-04-10 DIAGNOSIS — E119 Type 2 diabetes mellitus without complications: Secondary | ICD-10-CM | POA: Diagnosis not present

## 2019-04-10 DIAGNOSIS — E038 Other specified hypothyroidism: Secondary | ICD-10-CM | POA: Diagnosis not present

## 2019-04-10 DIAGNOSIS — Z79899 Other long term (current) drug therapy: Secondary | ICD-10-CM | POA: Diagnosis not present

## 2019-04-10 DIAGNOSIS — R112 Nausea with vomiting, unspecified: Secondary | ICD-10-CM | POA: Diagnosis not present

## 2019-04-10 DIAGNOSIS — D126 Benign neoplasm of colon, unspecified: Secondary | ICD-10-CM | POA: Diagnosis not present

## 2019-05-22 ENCOUNTER — Ambulatory Visit (INDEPENDENT_AMBULATORY_CARE_PROVIDER_SITE_OTHER): Payer: 59 | Admitting: Psychology

## 2019-05-22 DIAGNOSIS — F411 Generalized anxiety disorder: Secondary | ICD-10-CM

## 2019-07-03 ENCOUNTER — Ambulatory Visit (INDEPENDENT_AMBULATORY_CARE_PROVIDER_SITE_OTHER): Payer: 59 | Admitting: Psychology

## 2019-07-03 DIAGNOSIS — F411 Generalized anxiety disorder: Secondary | ICD-10-CM | POA: Diagnosis not present

## 2019-09-04 ENCOUNTER — Ambulatory Visit (INDEPENDENT_AMBULATORY_CARE_PROVIDER_SITE_OTHER): Payer: 59 | Admitting: Psychology

## 2019-09-04 DIAGNOSIS — F411 Generalized anxiety disorder: Secondary | ICD-10-CM | POA: Diagnosis not present

## 2019-10-16 ENCOUNTER — Ambulatory Visit: Payer: 59 | Admitting: Psychology

## 2019-12-18 ENCOUNTER — Ambulatory Visit (INDEPENDENT_AMBULATORY_CARE_PROVIDER_SITE_OTHER): Payer: 59 | Admitting: Psychology

## 2019-12-18 DIAGNOSIS — F411 Generalized anxiety disorder: Secondary | ICD-10-CM

## 2020-02-05 ENCOUNTER — Ambulatory Visit (INDEPENDENT_AMBULATORY_CARE_PROVIDER_SITE_OTHER): Payer: 59 | Admitting: Psychology

## 2020-02-05 DIAGNOSIS — F411 Generalized anxiety disorder: Secondary | ICD-10-CM | POA: Diagnosis not present

## 2020-02-11 ENCOUNTER — Encounter: Payer: Self-pay | Admitting: Internal Medicine

## 2020-03-18 ENCOUNTER — Ambulatory Visit (AMBULATORY_SURGERY_CENTER): Payer: 59 | Admitting: *Deleted

## 2020-03-18 ENCOUNTER — Other Ambulatory Visit: Payer: Self-pay

## 2020-03-18 VITALS — Ht 68.0 in | Wt 155.0 lb

## 2020-03-18 DIAGNOSIS — Z01818 Encounter for other preprocedural examination: Secondary | ICD-10-CM

## 2020-03-18 DIAGNOSIS — Z86010 Personal history of colonic polyps: Secondary | ICD-10-CM

## 2020-03-18 MED ORDER — SUTAB 1479-225-188 MG PO TABS: 1.0000 | ORAL_TABLET | ORAL | 0 refills | Status: DC

## 2020-03-18 NOTE — Progress Notes (Signed)
Patient's pre-visit was done today over the phone with the patient due to COVID-19 pandemic. Name,DOB and address verified. Insurance verified. Packet of Prep instructions mailed to patient including copy of a consent form and pre-procedure patient acknowledgement form-pt is aware. [employer]on included. Patient understands to call us back with any questions or concerns. COVID-19 screening test is on 03/29/2020, the patient is aware. Pt is aware that care partner will wait in the car during procedure; if they feel like they will be too hot or cold to wait in the car; they may wait in the 4 th floor lobby. Patient is aware to bring only one care partner. We want them to wear a mask (we do not have any that we can provide them), practice social distancing, and we will check their temperatures when they get here.  I did remind the patient that their care partner needs to stay in the parking lot the entire time and have a cell phone available, we will call them when the pt is ready for discharge. Patient will wear mask into building.

## 2020-03-25 ENCOUNTER — Ambulatory Visit (INDEPENDENT_AMBULATORY_CARE_PROVIDER_SITE_OTHER): Payer: 59 | Admitting: Psychology

## 2020-03-25 DIAGNOSIS — F411 Generalized anxiety disorder: Secondary | ICD-10-CM | POA: Diagnosis not present

## 2020-03-29 ENCOUNTER — Other Ambulatory Visit: Payer: Self-pay | Admitting: Internal Medicine

## 2020-03-29 ENCOUNTER — Ambulatory Visit (INDEPENDENT_AMBULATORY_CARE_PROVIDER_SITE_OTHER): Payer: 59

## 2020-03-29 DIAGNOSIS — Z1159 Encounter for screening for other viral diseases: Secondary | ICD-10-CM

## 2020-03-29 LAB — SARS CORONAVIRUS 2 (TAT 6-24 HRS): SARS Coronavirus 2: NEGATIVE

## 2020-03-30 ENCOUNTER — Encounter: Payer: Self-pay | Admitting: Internal Medicine

## 2020-04-01 ENCOUNTER — Encounter: Payer: Self-pay | Admitting: Internal Medicine

## 2020-04-01 ENCOUNTER — Ambulatory Visit (AMBULATORY_SURGERY_CENTER): Payer: 59 | Admitting: Internal Medicine

## 2020-04-01 ENCOUNTER — Encounter: Payer: 59 | Admitting: Internal Medicine

## 2020-04-01 ENCOUNTER — Other Ambulatory Visit: Payer: Self-pay

## 2020-04-01 VITALS — BP 113/51 | HR 83 | Temp 96.9°F | Resp 15 | Ht 69.6 in | Wt 155.0 lb

## 2020-04-01 DIAGNOSIS — K635 Polyp of colon: Secondary | ICD-10-CM | POA: Diagnosis not present

## 2020-04-01 DIAGNOSIS — Z86010 Personal history of colonic polyps: Secondary | ICD-10-CM

## 2020-04-01 DIAGNOSIS — D120 Benign neoplasm of cecum: Secondary | ICD-10-CM

## 2020-04-01 DIAGNOSIS — D123 Benign neoplasm of transverse colon: Secondary | ICD-10-CM

## 2020-04-01 MED ORDER — SODIUM CHLORIDE 0.9 % IV SOLN: 500.0000 mL | Freq: Once | INTRAVENOUS | Status: DC

## 2020-04-01 NOTE — Op Note (Signed)
[place] Patient Name: Nancy Camacho Procedure Date: 04/01/2020 11:36 AM MRN: [id number] Endoscopist: Jerene Bears , MD Age: 59 Referring MD:  Date of Birth: [birth year] Gender: Female Account #: [id number] Procedure:                Colonoscopy Indications:              High risk colon cancer surveillance: Personal                            history of non-advanced adenoma and sessile                            serrated colon polyp (less than 10 mm in size) with                            no dysplasia, Last colonoscopy: April 2015 Medicines:                Monitored Anesthesia Care Procedure:                Pre-Anesthesia Assessment:                           - Prior to the procedure, a History and Physical                            was performed, and patient medications and                            allergies were reviewed. The patient's tolerance of                            previous anesthesia was also reviewed. The risks                            and benefits of the procedure and the sedation                            options and risks were discussed with the patient.                            All questions were answered, and informed consent                            was obtained. Prior Anticoagulants: The patient has                            taken no previous anticoagulant or antiplatelet                            agents. ASA Grade Assessment: II - A patient with                            mild systemic disease. After reviewing the risks  and benefits, the patient was deemed in                            satisfactory condition to undergo the procedure.                           After obtaining informed consent, the colonoscope                            was passed under direct vision. Throughout the                            procedure, the patient's blood pressure, pulse, and                            oxygen saturations were  monitored continuously. The                            Colonoscope was introduced through the anus and                            advanced to the cecum, identified by appendiceal                            orifice and ileocecal valve. The colonoscopy was                            performed without difficulty. The patient tolerated                            the procedure well. The quality of the bowel                            preparation was good. The ileocecal valve,                            appendiceal orifice, and rectum were photographed.                            The bowel preparation used was 2 day MiraLax +                            SuTab via split dose instruction. Scope In: 11:47:52 AM Scope Out: 12:07:45 PM Scope Withdrawal Time: 0 hours 14 minutes 21 seconds  Total Procedure Duration: 0 hours 19 minutes 53 seconds  Findings:                 The digital rectal exam was normal.                           Two sessile polyps were found in the cecum. The                            polyps were 2 to 5 mm in size. These  polyps were                            removed with a cold snare. Resection and retrieval                            were complete.                           A 4 mm polyp was found in the transverse colon. The                            polyp was sessile. The polyp was removed with a                            cold snare. Resection and retrieval were complete.                           A 6 mm polyp was found in the sigmoid colon. The                            polyp was sessile. The polyp was removed with a                            cold snare. Resection was complete, but the polyp                            tissue was not retrieved.                           A few small-mouthed diverticula were found in the                            sigmoid colon and descending colon.                           Retroflexion in the rectum was not performed due to                             anatomy. Good views obtained from the distal rectum                            and anorectal verge in forward view. Complications:            No immediate complications. Estimated Blood Loss:     Estimated blood loss was minimal. Impression:               - Two 2 to 5 mm polyps in the cecum, removed with a                            cold snare. Resected and retrieved.                           - One 4 mm polyp  in the transverse colon, removed                            with a cold snare. Resected and retrieved.                           - One 6 mm polyp in the sigmoid colon, removed with                            a cold snare. Complete resection. Polyp tissue not                            retrieved.                           - Diverticulosis in the sigmoid colon and in the                            descending colon. Recommendation:           - Patient has a contact number available for                            emergencies. The signs and symptoms of potential                            delayed complications were discussed with the                            patient. Return to normal activities tomorrow.                            Written discharge instructions were provided to the                            patient.                           - Resume previous diet.                           - Continue present medications.                           - Await pathology results.                           - Repeat colonoscopy is recommended for                            surveillance. The colonoscopy date will be                            determined after pathology results from today's                            exam  become available for review. Jerene Bears, MD 04/01/2020 12:14:14 PM This report has been signed electronically.

## 2020-04-01 NOTE — Progress Notes (Signed)
Report to PACU, RN, vss, BBS= Clear.  

## 2020-04-01 NOTE — Progress Notes (Signed)
JB - Temp DT- VS  Pt's states no medical or surgical changes since previsit or office visit. Maw  Per Pt she took her tresiba half a dose this am at 05:30 am.  Blood sugar was 155 when checked on admission to the admitting area. Instructed pt to tell us she begins to sx of low glucose.  Pt said she will let us know. maw

## 2020-04-01 NOTE — Patient Instructions (Signed)
Handouts given:  Polyps, diverticulosis Resume previous diet Continue present medications Await pathology results   YOU HAD AN ENDOSCOPIC PROCEDURE TODAY AT [facility]:   Refer to the procedure report that was given to you for any specific questions about what was found during the examination.  If the procedure report does not answer your questions, please call your gastroenterologist to clarify.  If you requested that your care partner not be given the details of your procedure findings, then the procedure report has been included in a sealed envelope for you to review at your convenience later.  YOU SHOULD EXPECT: Some feelings of bloating in the abdomen. Passage of more gas than usual.  Walking can help get rid of the air that was put into your GI tract during the procedure and reduce the bloating. If you had a lower endoscopy (such as a colonoscopy or flexible sigmoidoscopy) you may notice spotting of blood in your stool or on the toilet paper. If you underwent a bowel prep for your procedure, you may not have a normal bowel movement for a few days.  Please Note:  You might notice some irritation and congestion in your nose or some drainage.  This is from the oxygen used during your procedure.  There is no need for concern and it should clear up in a day or so.  SYMPTOMS TO REPORT IMMEDIATELY:   Following lower endoscopy (colonoscopy or flexible sigmoidoscopy):  Excessive amounts of blood in the stool  Significant tenderness or worsening of abdominal pains  Swelling of the abdomen that is new, acute  Fever of 100F or higher   For urgent or emergent issues, a gastroenterologist can be reached at any hour by calling [phone]. Do not use MyChart messaging for urgent concerns.    DIET:  We do recommend a small meal at first, but then you may proceed to your regular diet.  Drink plenty of fluids but you should avoid alcoholic beverages for 24 hours.  ACTIVITY:  You  should plan to take it easy for the rest of today and you should NOT DRIVE or use heavy machinery until tomorrow (because of the sedation medicines used during the test).    FOLLOW UP: Our staff will call the number listed on your records 48-72 hours following your procedure to check on you and address any questions or concerns that you may have regarding the information given to you following your procedure. If we do not reach you, we will leave a message.  We will attempt to reach you two times.  During this call, we will ask if you have developed any symptoms of COVID 19. If you develop any symptoms (ie: fever, flu-like symptoms, shortness of breath, cough etc.) before then, please call [phone].  If you test positive for Covid 19 in the 2 weeks post procedure, please call and report this information to [place].    If any biopsies were taken you will be contacted by phone or by letter within the next 1-3 weeks.  Please call us at [phone] if you have not heard about the biopsies in 3 weeks.    SIGNATURES/CONFIDENTIALITY: You and/or your care partner have signed paperwork which will be entered into your electronic medical record.  These signatures attest to the fact that that the information above on your After Visit Summary has been reviewed and is understood.  Full responsibility of the confidentiality of this discharge information lies with you and/or your care-partner.

## 2020-04-01 NOTE — Progress Notes (Signed)
Called to room to assist during endoscopic procedure.  Patient ID and intended procedure confirmed with present staff. Received instructions for my participation in the procedure from the performing physician.  

## 2020-04-05 ENCOUNTER — Encounter: Payer: Self-pay | Admitting: Internal Medicine

## 2020-04-05 ENCOUNTER — Telehealth: Payer: Self-pay | Admitting: *Deleted

## 2020-04-05 NOTE — Telephone Encounter (Signed)
  Follow up Call-  Call back number 04/01/2020  Post procedure Call Back phone  # [phone] cell  Permission to leave phone message Yes  Some recent data might be hidden     Patient questions:  Do you have a fever, pain , or abdominal swelling? No. Pain Score  0 *  Have you tolerated food without any problems? yes  Have you been able to return to your normal activities? yes  Do you have any questions about your discharge instructions: Diet   No. Medications  No. Follow up visit  No.  Do you have questions or concerns about your Care? No.  Actions: * If pain score is 4 or above: No action needed, pain <4.  1. Have you developed a fever since your procedure? no  2.   Have you had an respiratory symptoms (SOB or cough) since your procedure? no  3.   Have you tested positive for COVID 19 since your procedure no  4.   Have you had any family members/close contacts diagnosed with the COVID 19 since your procedure? no   If yes to any of these questions please route to Joylene John, RN and Erenest Rasher, RN

## 2020-04-29 ENCOUNTER — Ambulatory Visit (INDEPENDENT_AMBULATORY_CARE_PROVIDER_SITE_OTHER): Payer: 59 | Admitting: Psychology

## 2020-04-29 DIAGNOSIS — F411 Generalized anxiety disorder: Secondary | ICD-10-CM

## 2020-07-08 ENCOUNTER — Ambulatory Visit (INDEPENDENT_AMBULATORY_CARE_PROVIDER_SITE_OTHER): Payer: 59 | Admitting: Psychology

## 2020-07-08 DIAGNOSIS — F411 Generalized anxiety disorder: Secondary | ICD-10-CM | POA: Diagnosis not present

## 2020-09-02 ENCOUNTER — Ambulatory Visit: Payer: 59 | Admitting: Psychology

## 2020-09-23 ENCOUNTER — Ambulatory Visit (INDEPENDENT_AMBULATORY_CARE_PROVIDER_SITE_OTHER): Payer: 59 | Admitting: Psychology

## 2020-09-23 DIAGNOSIS — F411 Generalized anxiety disorder: Secondary | ICD-10-CM | POA: Diagnosis not present

## 2020-11-04 ENCOUNTER — Ambulatory Visit: Payer: 59 | Admitting: Psychology

## 2020-11-09 ENCOUNTER — Ambulatory Visit (INDEPENDENT_AMBULATORY_CARE_PROVIDER_SITE_OTHER): Payer: 59

## 2020-11-09 ENCOUNTER — Other Ambulatory Visit: Payer: Self-pay

## 2020-11-09 ENCOUNTER — Ambulatory Visit (INDEPENDENT_AMBULATORY_CARE_PROVIDER_SITE_OTHER): Payer: 59 | Admitting: Podiatry

## 2020-11-09 DIAGNOSIS — M79671 Pain in right foot: Secondary | ICD-10-CM

## 2020-11-09 DIAGNOSIS — M79672 Pain in left foot: Secondary | ICD-10-CM

## 2020-11-09 DIAGNOSIS — M2041 Other hammer toe(s) (acquired), right foot: Secondary | ICD-10-CM | POA: Diagnosis not present

## 2020-11-09 DIAGNOSIS — L600 Ingrowing nail: Secondary | ICD-10-CM | POA: Diagnosis not present

## 2020-11-09 DIAGNOSIS — M2042 Other hammer toe(s) (acquired), left foot: Secondary | ICD-10-CM | POA: Diagnosis not present

## 2020-11-09 DIAGNOSIS — B351 Tinea unguium: Secondary | ICD-10-CM

## 2020-11-09 MED ORDER — TERBINAFINE HCL 250 MG PO TABS: 250.0000 mg | ORAL_TABLET | Freq: Every day | ORAL | 0 refills | Status: DC

## 2020-11-09 NOTE — Patient Instructions (Signed)

## 2020-11-15 ENCOUNTER — Other Ambulatory Visit: Payer: Self-pay

## 2020-11-15 MED ORDER — TERBINAFINE HCL 250 MG PO TABS: 250.0000 mg | ORAL_TABLET | Freq: Every day | ORAL | 0 refills | Status: DC

## 2020-11-21 NOTE — Progress Notes (Signed)
Subjective: Patient presents today for evaluation of pain to the medial border of the right second toe. Patient is concerned for possible ingrown nail.  Patient also would like to discuss hammertoe deformities to the bilateral fourth digits.  She is also concerned for possible thickening and fungal nails to the bilateral feet.  Currently she has not done anything for treatment.  She states that the discoloration and thickening of the nails has been going on for approximately 6 months to 1 year.  Patient presents today for further treatment and evaluation.  Past Medical History:  Diagnosis Date  . Anxiety   . Chronic back pain   . Depression   . Diabetes mellitus without complication (HCC)   . History of Bell's palsy   . History of seborrhea    ear  . Thyroid disease     Objective:  General: Well developed, nourished, in no acute distress, alert and oriented x3   Dermatology: Skin is warm, dry and supple bilateral.  Medial border right second toe appears to be erythematous with evidence of an ingrowing nail. Pain on palpation noted to the border of the nail fold. The remaining nails appear unremarkable at this time. There are no open sores, lesions. Hyperkeratotic discolored dystrophic nails also noted to the right second toe as well as the bilateral great toes  Vascular: Dorsalis Pedis artery and Posterior Tibial artery pedal pulses palpable. No lower extremity edema noted.   Neruologic: Grossly intact via light touch bilateral.  Musculoskeletal: Muscular strength within normal limits in all groups bilateral. Normal range of motion noted to all pedal and ankle joints.  Adductovarus hammertoe contracture deformity noted to the fourth digits bilaterally  Assesement: #1 Paronychia with ingrowing nail medial border right second toe #2  Onychomycosis of toenails right second digit, bilateral great toes #3 adductovarus hammertoe fourth digit bilateral  Plan of Care:  1. Patient  evaluated.  2. Discussed treatment alternatives and plan of care. Explained nail avulsion procedure and post procedure course to patient. 3. Patient opted for permanent partial nail avulsion of the medial border right second toe.  4. Prior to procedure, local anesthesia infiltration utilized using 3 ml of a 50:50 mixture of 2% plain lidocaine and 0.5% plain marcaine in a normal hallux block fashion and a betadine prep performed.  5. Partial permanent nail avulsion with chemical matrixectomy performed using 3x30sec applications of phenol followed by alcohol flush.  6. Light dressing applied. 7.  Silvadene cream provided to apply daily  8.  To address the onychomycosis of the toenails, Lamisil 250 mg #90 daily prescribed.  Patient denies any liver pathology or symptoms and is otherwise healthy  9.  Today we discussed surgical versus conservative management of the adductovarus hammertoes to the bilateral feet.  At the moment recommend conservative treatment including good supportive shoe gear and shoes that do not constrict the toebox area  10.  Return to clinic 2 weeks.  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 Felecia Shelling, DPM    [address] [place], [place] [address]                Office [phone]  Fax [phone]

## 2020-12-16 ENCOUNTER — Ambulatory Visit (INDEPENDENT_AMBULATORY_CARE_PROVIDER_SITE_OTHER): Payer: 59 | Admitting: Psychology

## 2020-12-16 DIAGNOSIS — F411 Generalized anxiety disorder: Secondary | ICD-10-CM | POA: Diagnosis not present

## 2021-01-15 ENCOUNTER — Encounter (HOSPITAL_BASED_OUTPATIENT_CLINIC_OR_DEPARTMENT_OTHER): Payer: Self-pay | Admitting: Emergency Medicine

## 2021-01-15 ENCOUNTER — Emergency Department (HOSPITAL_BASED_OUTPATIENT_CLINIC_OR_DEPARTMENT_OTHER): Payer: 59

## 2021-01-15 ENCOUNTER — Other Ambulatory Visit: Payer: Self-pay

## 2021-01-15 ENCOUNTER — Inpatient Hospital Stay (HOSPITAL_BASED_OUTPATIENT_CLINIC_OR_DEPARTMENT_OTHER)
Admission: EM | Admit: 2021-01-15 | Discharge: 2021-01-18 | DRG: 637 | Disposition: A | Discharge status: Home / Self Care | Payer: 59 | Attending: Internal Medicine | Admitting: Internal Medicine

## 2021-01-15 DIAGNOSIS — Z79899 Other long term (current) drug therapy: Secondary | ICD-10-CM | POA: Diagnosis not present

## 2021-01-15 DIAGNOSIS — J329 Chronic sinusitis, unspecified: Secondary | ICD-10-CM | POA: Diagnosis present

## 2021-01-15 DIAGNOSIS — R000 Tachycardia, unspecified: Secondary | ICD-10-CM | POA: Diagnosis present

## 2021-01-15 DIAGNOSIS — R778 Other specified abnormalities of plasma proteins: Secondary | ICD-10-CM

## 2021-01-15 DIAGNOSIS — Z791 Long term (current) use of non-steroidal anti-inflammatories (NSAID): Secondary | ICD-10-CM

## 2021-01-15 DIAGNOSIS — G8929 Other chronic pain: Secondary | ICD-10-CM | POA: Diagnosis present

## 2021-01-15 DIAGNOSIS — F32A Depression, unspecified: Secondary | ICD-10-CM | POA: Diagnosis present

## 2021-01-15 DIAGNOSIS — Z794 Long term (current) use of insulin: Secondary | ICD-10-CM | POA: Diagnosis not present

## 2021-01-15 DIAGNOSIS — E876 Hypokalemia: Secondary | ICD-10-CM | POA: Diagnosis present

## 2021-01-15 DIAGNOSIS — R7989 Other specified abnormal findings of blood chemistry: Secondary | ICD-10-CM | POA: Diagnosis not present

## 2021-01-15 DIAGNOSIS — Z793 Long term (current) use of hormonal contraceptives: Secondary | ICD-10-CM | POA: Diagnosis not present

## 2021-01-15 DIAGNOSIS — E119 Type 2 diabetes mellitus without complications: Secondary | ICD-10-CM

## 2021-01-15 DIAGNOSIS — I248 Other forms of acute ischemic heart disease: Secondary | ICD-10-CM | POA: Diagnosis present

## 2021-01-15 DIAGNOSIS — Z833 Family history of diabetes mellitus: Secondary | ICD-10-CM

## 2021-01-15 DIAGNOSIS — Z79890 Hormone replacement therapy (postmenopausal): Secondary | ICD-10-CM | POA: Diagnosis not present

## 2021-01-15 DIAGNOSIS — M549 Dorsalgia, unspecified: Secondary | ICD-10-CM | POA: Diagnosis present

## 2021-01-15 DIAGNOSIS — E1110 Type 2 diabetes mellitus with ketoacidosis without coma: Secondary | ICD-10-CM | POA: Diagnosis present

## 2021-01-15 DIAGNOSIS — N179 Acute kidney failure, unspecified: Secondary | ICD-10-CM | POA: Diagnosis present

## 2021-01-15 DIAGNOSIS — R9431 Abnormal electrocardiogram [ECG] [EKG]: Secondary | ICD-10-CM | POA: Diagnosis not present

## 2021-01-15 DIAGNOSIS — E861 Hypovolemia: Secondary | ICD-10-CM | POA: Diagnosis present

## 2021-01-15 DIAGNOSIS — E872 Acidosis: Secondary | ICD-10-CM | POA: Diagnosis not present

## 2021-01-15 DIAGNOSIS — E039 Hypothyroidism, unspecified: Secondary | ICD-10-CM | POA: Diagnosis present

## 2021-01-15 DIAGNOSIS — E1310 Other specified diabetes mellitus with ketoacidosis without coma: Secondary | ICD-10-CM

## 2021-01-15 DIAGNOSIS — R0602 Shortness of breath: Secondary | ICD-10-CM | POA: Diagnosis present

## 2021-01-15 DIAGNOSIS — Z888 Allergy status to other drugs, medicaments and biological substances status: Secondary | ICD-10-CM | POA: Diagnosis not present

## 2021-01-15 DIAGNOSIS — G9341 Metabolic encephalopathy: Secondary | ICD-10-CM | POA: Diagnosis not present

## 2021-01-15 DIAGNOSIS — U071 COVID-19: Secondary | ICD-10-CM | POA: Diagnosis present

## 2021-01-15 DIAGNOSIS — Z87891 Personal history of nicotine dependence: Secondary | ICD-10-CM

## 2021-01-15 DIAGNOSIS — F419 Anxiety disorder, unspecified: Secondary | ICD-10-CM | POA: Diagnosis present

## 2021-01-15 DIAGNOSIS — Z7984 Long term (current) use of oral hypoglycemic drugs: Secondary | ICD-10-CM | POA: Diagnosis not present

## 2021-01-15 DIAGNOSIS — D72829 Elevated white blood cell count, unspecified: Secondary | ICD-10-CM | POA: Diagnosis present

## 2021-01-15 LAB — CBC
HCT: 52.1 % — ABNORMAL HIGH (ref 36.0–46.0)
HCT: 42.7 % (ref 36.0–46.0)
Hemoglobin: 15.5 g/dL — ABNORMAL HIGH (ref 12.0–15.0)
Hemoglobin: 13.3 g/dL (ref 12.0–15.0)
MCH: 30.3 pg (ref 26.0–34.0)
MCH: 30 pg (ref 26.0–34.0)
MCHC: 29.8 g/dL — ABNORMAL LOW (ref 30.0–36.0)
MCHC: 31.1 g/dL (ref 30.0–36.0)
MCV: 102 fL — ABNORMAL HIGH (ref 80.0–100.0)
MCV: 96.2 fL (ref 80.0–100.0)
Platelets: 468 10*3/uL — ABNORMAL HIGH (ref 150–400)
Platelets: 291 10*3/uL (ref 150–400)
RBC: 5.11 MIL/uL (ref 3.87–5.11)
RBC: 4.44 MIL/uL (ref 3.87–5.11)
RDW: 14.2 % (ref 11.5–15.5)
RDW: 14.1 % (ref 11.5–15.5)
WBC: 19 10*3/uL — ABNORMAL HIGH (ref 4.0–10.5)
WBC: 15.5 10*3/uL — ABNORMAL HIGH (ref 4.0–10.5)
nRBC: 0 % (ref 0.0–0.2)
nRBC: 0 % (ref 0.0–0.2)

## 2021-01-15 LAB — SARS CORONAVIRUS 2 (TAT 6-24 HRS): SARS Coronavirus 2: POSITIVE — AB

## 2021-01-15 LAB — COMPREHENSIVE METABOLIC PANEL
ALT: 33 U/L (ref 0–44)
AST: 46 U/L — ABNORMAL HIGH (ref 15–41)
Albumin: 4.5 g/dL (ref 3.5–5.0)
Alkaline Phosphatase: 100 U/L (ref 38–126)
Anion gap: 28 — ABNORMAL HIGH (ref 5–15)
BUN: 49 mg/dL — ABNORMAL HIGH (ref 6–20)
CO2: 6 mmol/L — ABNORMAL LOW (ref 22–32)
Calcium: 9.6 mg/dL (ref 8.9–10.3)
Chloride: 99 mmol/L (ref 98–111)
Creatinine, Ser: 1.61 mg/dL — ABNORMAL HIGH (ref 0.44–1.00)
GFR, Estimated: 37 mL/min — ABNORMAL LOW (ref >60)
Glucose, Bld: 567 mg/dL (ref 70–99)
Potassium: 5.2 mmol/L — ABNORMAL HIGH (ref 3.5–5.1)
Sodium: 133 mmol/L — ABNORMAL LOW (ref 135–145)
Total Bilirubin: 1 mg/dL (ref 0.3–1.2)
Total Protein: 8.1 g/dL (ref 6.5–8.1)

## 2021-01-15 LAB — CBG MONITORING, ED
Glucose-Capillary: 459 mg/dL — ABNORMAL HIGH (ref 70–99)
Glucose-Capillary: 402 mg/dL — ABNORMAL HIGH (ref 70–99)
Glucose-Capillary: 462 mg/dL — ABNORMAL HIGH (ref 70–99)
Glucose-Capillary: 367 mg/dL — ABNORMAL HIGH (ref 70–99)
Glucose-Capillary: 286 mg/dL — ABNORMAL HIGH (ref 70–99)
Glucose-Capillary: 233 mg/dL — ABNORMAL HIGH (ref 70–99)
Glucose-Capillary: 206 mg/dL — ABNORMAL HIGH (ref 70–99)

## 2021-01-15 LAB — I-STAT VENOUS BLOOD GAS, ED
Acid-base deficit: 29 mmol/L — ABNORMAL HIGH (ref 0.0–2.0)
Bicarbonate: 4.8 mmol/L — ABNORMAL LOW (ref 20.0–28.0)
Calcium, Ion: 1.38 mmol/L (ref 1.15–1.40)
HCT: 51 % — ABNORMAL HIGH (ref 36.0–46.0)
Hemoglobin: 17.3 g/dL — ABNORMAL HIGH (ref 12.0–15.0)
O2 Saturation: 51 %
Patient temperature: 98.6
Potassium: 5.5 mmol/L — ABNORMAL HIGH (ref 3.5–5.1)
Sodium: 133 mmol/L — ABNORMAL LOW (ref 135–145)
TCO2: 6 mmol/L — ABNORMAL LOW (ref 22–32)
pCO2, Ven: 28.2 mmHg — ABNORMAL LOW (ref 44.0–60.0)
pH, Ven: 6.84 — CL (ref 7.250–7.430)
pO2, Ven: 48 mmHg — ABNORMAL HIGH (ref 32.0–45.0)

## 2021-01-15 LAB — URINALYSIS, ROUTINE W REFLEX MICROSCOPIC
Bilirubin Urine: NEGATIVE
Glucose, UA: >=500 mg/dL — AB
Ketones, ur: NEGATIVE mg/dL
Leukocytes,Ua: NEGATIVE
Nitrite: NEGATIVE
Protein, ur: NEGATIVE mg/dL
Specific Gravity, Urine: 1.02 (ref 1.005–1.030)
pH: 5 (ref 5.0–8.0)

## 2021-01-15 LAB — MRSA PCR SCREENING: MRSA by PCR: NEGATIVE

## 2021-01-15 LAB — BASIC METABOLIC PANEL
Anion gap: >20 — ABNORMAL HIGH (ref 5–15)
Anion gap: 19 — ABNORMAL HIGH (ref 5–15)
Anion gap: 16 — ABNORMAL HIGH (ref 5–15)
Anion gap: 18 — ABNORMAL HIGH (ref 5–15)
BUN: 45 mg/dL — ABNORMAL HIGH (ref 6–20)
BUN: 36 mg/dL — ABNORMAL HIGH (ref 6–20)
BUN: 31 mg/dL — ABNORMAL HIGH (ref 6–20)
BUN: 29 mg/dL — ABNORMAL HIGH (ref 6–20)
CO2: <7 mmol/L — ABNORMAL LOW (ref 22–32)
CO2: 15 mmol/L — ABNORMAL LOW (ref 22–32)
CO2: 19 mmol/L — ABNORMAL LOW (ref 22–32)
CO2: 19 mmol/L — ABNORMAL LOW (ref 22–32)
Calcium: 8.8 mg/dL — ABNORMAL LOW (ref 8.9–10.3)
Calcium: 8.5 mg/dL — ABNORMAL LOW (ref 8.9–10.3)
Calcium: 8.6 mg/dL — ABNORMAL LOW (ref 8.9–10.3)
Calcium: 8.4 mg/dL — ABNORMAL LOW (ref 8.9–10.3)
Chloride: 109 mmol/L (ref 98–111)
Chloride: 109 mmol/L (ref 98–111)
Chloride: 106 mmol/L (ref 98–111)
Chloride: 103 mmol/L (ref 98–111)
Creatinine, Ser: 1.37 mg/dL — ABNORMAL HIGH (ref 0.44–1.00)
Creatinine, Ser: 1.15 mg/dL — ABNORMAL HIGH (ref 0.44–1.00)
Creatinine, Ser: 0.92 mg/dL (ref 0.44–1.00)
Creatinine, Ser: 1 mg/dL (ref 0.44–1.00)
GFR, Estimated: 45 mL/min — ABNORMAL LOW (ref >60)
GFR, Estimated: 56 mL/min — ABNORMAL LOW (ref >60)
GFR, Estimated: >60 mL/min (ref >60)
GFR, Estimated: >60 mL/min (ref >60)
Glucose, Bld: 373 mg/dL — ABNORMAL HIGH (ref 70–99)
Glucose, Bld: 196 mg/dL — ABNORMAL HIGH (ref 70–99)
Glucose, Bld: 147 mg/dL — ABNORMAL HIGH (ref 70–99)
Glucose, Bld: 132 mg/dL — ABNORMAL HIGH (ref 70–99)
Potassium: 4.4 mmol/L (ref 3.5–5.1)
Potassium: 4.2 mmol/L (ref 3.5–5.1)
Potassium: 3.7 mmol/L (ref 3.5–5.1)
Potassium: 4.2 mmol/L (ref 3.5–5.1)
Sodium: 136 mmol/L (ref 135–145)
Sodium: 143 mmol/L (ref 135–145)
Sodium: 141 mmol/L (ref 135–145)
Sodium: 140 mmol/L (ref 135–145)

## 2021-01-15 LAB — PREGNANCY, URINE: Preg Test, Ur: NEGATIVE

## 2021-01-15 LAB — URINALYSIS, MICROSCOPIC (REFLEX)

## 2021-01-15 LAB — BLOOD GAS, ARTERIAL
Acid-base deficit: 8.9 mmol/L — ABNORMAL HIGH (ref 0.0–2.0)
Bicarbonate: 14.2 mmol/L — ABNORMAL LOW (ref 20.0–28.0)
FIO2: 21
O2 Saturation: 97.9 %
Patient temperature: 98.6
pCO2 arterial: 23.9 mmHg — ABNORMAL LOW (ref 32.0–48.0)
pH, Arterial: 7.391 (ref 7.350–7.450)
pO2, Arterial: 84.3 mmHg (ref 83.0–108.0)

## 2021-01-15 LAB — GLUCOSE, CAPILLARY
Glucose-Capillary: 193 mg/dL — ABNORMAL HIGH (ref 70–99)
Glucose-Capillary: 166 mg/dL — ABNORMAL HIGH (ref 70–99)
Glucose-Capillary: 158 mg/dL — ABNORMAL HIGH (ref 70–99)
Glucose-Capillary: 167 mg/dL — ABNORMAL HIGH (ref 70–99)
Glucose-Capillary: 136 mg/dL — ABNORMAL HIGH (ref 70–99)
Glucose-Capillary: 124 mg/dL — ABNORMAL HIGH (ref 70–99)
Glucose-Capillary: 125 mg/dL — ABNORMAL HIGH (ref 70–99)
Glucose-Capillary: 119 mg/dL — ABNORMAL HIGH (ref 70–99)
Glucose-Capillary: 129 mg/dL — ABNORMAL HIGH (ref 70–99)
Glucose-Capillary: 144 mg/dL — ABNORMAL HIGH (ref 70–99)
Glucose-Capillary: 156 mg/dL — ABNORMAL HIGH (ref 70–99)

## 2021-01-15 LAB — BETA-HYDROXYBUTYRIC ACID
Beta-Hydroxybutyric Acid: >8 mmol/L — ABNORMAL HIGH (ref 0.05–0.27)
Beta-Hydroxybutyric Acid: 6.21 mmol/L — ABNORMAL HIGH (ref 0.05–0.27)
Beta-Hydroxybutyric Acid: >8 mmol/L — ABNORMAL HIGH (ref 0.05–0.27)

## 2021-01-15 LAB — MAGNESIUM: Magnesium: 1.8 mg/dL (ref 1.7–2.4)

## 2021-01-15 LAB — LACTIC ACID, PLASMA: Lactic Acid, Venous: 3.7 mmol/L (ref 0.5–1.9)

## 2021-01-15 LAB — PHOSPHORUS: Phosphorus: 1.4 mg/dL — ABNORMAL LOW (ref 2.5–4.6)

## 2021-01-15 LAB — HEMOGLOBIN A1C
Hgb A1c MFr Bld: 7.7 % — ABNORMAL HIGH (ref 4.8–5.6)
Mean Plasma Glucose: 174.29 mg/dL

## 2021-01-15 LAB — HIV ANTIBODY (ROUTINE TESTING W REFLEX): HIV Screen 4th Generation wRfx: NONREACTIVE

## 2021-01-15 LAB — TROPONIN I (HIGH SENSITIVITY)
Troponin I (High Sensitivity): 162 ng/L (ref <18)
Troponin I (High Sensitivity): 189 ng/L (ref <18)

## 2021-01-15 LAB — LIPASE, BLOOD: Lipase: 27 U/L (ref 11–51)

## 2021-01-15 LAB — PROCALCITONIN: Procalcitonin: 1.38 ng/mL

## 2021-01-15 MED ORDER — ASPIRIN 81 MG PO CHEW
81.0000 mg | CHEWABLE_TABLET | Freq: Every day | ORAL | Status: DC
  Administered 2021-01-16 – 2021-01-18 (×3): 81 mg via ORAL
  Filled 2021-01-15 (×3): qty 1

## 2021-01-15 MED ORDER — HEPARIN SODIUM (PORCINE) 5000 UNIT/ML IJ SOLN
5000.0000 [IU] | Freq: Three times a day (TID) | INTRAMUSCULAR | Status: DC
  Administered 2021-01-15 (×2): 5000 [IU] via SUBCUTANEOUS
  Filled 2021-01-15 (×2): qty 1

## 2021-01-15 MED ORDER — INSULIN REGULAR(HUMAN) IN NACL 100-0.9 UT/100ML-% IV SOLN
INTRAVENOUS | Status: DC
  Administered 2021-01-15: 10 [IU]/h via INTRAVENOUS
  Filled 2021-01-15 (×2): qty 100

## 2021-01-15 MED ORDER — VORTIOXETINE HBR 5 MG PO TABS
10.0000 mg | ORAL_TABLET | Freq: Every day | ORAL | Status: DC
  Administered 2021-01-15 – 2021-01-18 (×4): 10 mg via ORAL
  Filled 2021-01-15 (×4): qty 2

## 2021-01-15 MED ORDER — LACTATED RINGERS IV SOLN
INTRAVENOUS | Status: DC
Start: 2021-01-15 — End: 2021-01-15

## 2021-01-15 MED ORDER — SODIUM CHLORIDE 0.9 % IV BOLUS: 1000.0000 mL | Freq: Once | INTRAVENOUS | Status: DC

## 2021-01-15 MED ORDER — LEVOTHYROXINE SODIUM 75 MCG PO TABS
75.0000 ug | ORAL_TABLET | Freq: Every day | ORAL | Status: DC
  Administered 2021-01-16 – 2021-01-18 (×3): 75 ug via ORAL
  Filled 2021-01-15: qty 1
  Filled 2021-01-15: qty 3
  Filled 2021-01-15: qty 1

## 2021-01-15 MED ORDER — ONDANSETRON HCL 4 MG/2ML IJ SOLN
4.0000 mg | Freq: Once | INTRAMUSCULAR | Status: AC
  Administered 2021-01-15: 4 mg via INTRAVENOUS
  Filled 2021-01-15: qty 2

## 2021-01-15 MED ORDER — DEXTROSE 50 % IV SOLN: 0.0000 mL | INTRAVENOUS | Status: DC | PRN

## 2021-01-15 MED ORDER — ESTRADIOL 1 MG PO TABS
0.5000 mg | ORAL_TABLET | Freq: Every day | ORAL | Status: DC
  Administered 2021-01-15 – 2021-01-18 (×4): 0.5 mg via ORAL
  Filled 2021-01-15 (×4): qty 0.5

## 2021-01-15 MED ORDER — SODIUM CHLORIDE 0.9 % IV SOLN
2.0000 g | Freq: Once | INTRAVENOUS | Status: AC
  Administered 2021-01-15: 2 g via INTRAVENOUS
  Filled 2021-01-15: qty 20

## 2021-01-15 MED ORDER — LACTATED RINGERS IV BOLUS
2000.0000 mL | Freq: Once | INTRAVENOUS | Status: AC
  Administered 2021-01-15: 1000 mL via INTRAVENOUS

## 2021-01-15 MED ORDER — LACTATED RINGERS IV SOLN: INTRAVENOUS | Status: DC

## 2021-01-15 MED ORDER — SODIUM CHLORIDE 0.9 % IV SOLN
INTRAVENOUS | Status: DC | PRN
  Administered 2021-01-15: 250 mL via INTRAVENOUS

## 2021-01-15 MED ORDER — METRONIDAZOLE IN NACL 5-0.79 MG/ML-% IV SOLN
500.0000 mg | Freq: Once | INTRAVENOUS | Status: AC
  Administered 2021-01-15: 500 mg via INTRAVENOUS
  Filled 2021-01-15: qty 100

## 2021-01-15 MED ORDER — DEXTROSE IN LACTATED RINGERS 5 % IV SOLN: INTRAVENOUS | Status: DC

## 2021-01-15 MED ORDER — CHLORHEXIDINE GLUCONATE CLOTH 2 % EX PADS
6.0000 | MEDICATED_PAD | Freq: Every day | CUTANEOUS | Status: DC
  Administered 2021-01-15 – 2021-01-17 (×3): 6 via TOPICAL

## 2021-01-15 MED ORDER — SODIUM BICARBONATE 8.4 % IV SOLN
INTRAVENOUS | Status: AC
  Administered 2021-01-15: 100 meq via INTRAVENOUS
  Filled 2021-01-15: qty 100

## 2021-01-15 MED ORDER — SODIUM BICARBONATE 8.4 % IV SOLN
100.0000 meq | Freq: Once | INTRAVENOUS | Status: AC
  Administered 2021-01-15: 100 meq via INTRAVENOUS
  Filled 2021-01-15: qty 150

## 2021-01-15 MED ORDER — GUAIFENESIN-DM 100-10 MG/5ML PO SYRP
10.0000 mL | ORAL_SOLUTION | ORAL | Status: DC | PRN
  Administered 2021-01-15 – 2021-01-17 (×4): 10 mL via ORAL
  Filled 2021-01-15 (×4): qty 10

## 2021-01-15 MED ORDER — LACTATED RINGERS IV BOLUS
1000.0000 mL | Freq: Once | INTRAVENOUS | Status: AC
  Administered 2021-01-15: 1000 mL via INTRAVENOUS

## 2021-01-15 MED ORDER — SODIUM CHLORIDE 0.9 % IV SOLN
2.0000 g | INTRAVENOUS | Status: DC
  Administered 2021-01-16 – 2021-01-18 (×3): 2 g via INTRAVENOUS
  Filled 2021-01-15 (×3): qty 20

## 2021-01-15 MED ORDER — AMPHETAMINE-DEXTROAMPHET ER 20 MG PO CP24
20.0000 mg | ORAL_CAPSULE | Freq: Every morning | ORAL | Status: DC
  Administered 2021-01-16 – 2021-01-18 (×3): 20 mg via ORAL
  Filled 2021-01-15 (×3): qty 1

## 2021-01-15 MED ORDER — SODIUM BICARBONATE 8.4 % IV SOLN
INTRAVENOUS | Status: DC
  Filled 2021-01-15 (×2): qty 150

## 2021-01-15 NOTE — Progress Notes (Signed)
Date and time results received: 01/15/2021 @ 1700    Critical Value: Covid positive   Name of Provider Notified: Dr. Loanne Drilling  Actions Taken: continue with current treatment

## 2021-01-15 NOTE — ED Notes (Signed)
Per EDP order, pt given ice chips as per request. Pt verbalized understanding to utilize call bell if nausea or emesis occur.

## 2021-01-15 NOTE — Progress Notes (Signed)
Pharmacy Antibiotic Note  Nancy Camacho is a 60 y.o. female admitted on 01/15/2021 with DKA.  Pharmacy has been consulted for ceftriaxone dosing for sinusitis.  Plan: Ceftriaxone 2 g iv daily.   Will sign off and follow remotely  Height: 5\' 9"  (175.3 cm) Weight: 68 kg (150 lb) IBW/kg (Calculated) : 66.2  Temp (24hrs), Avg:96.5 F (35.8 C), Min:94.3 F (34.6 C), Max:97.7 F (36.5 C)  Recent Labs  Lab 01/15/21 0602 01/15/21 0619 01/15/21 0847  WBC 19.0*  --   --   CREATININE 1.61*  --  1.37*  LATICACIDVEN  --  3.7*  --     Estimated Creatinine Clearance: 47.3 mL/min (A) (by C-G formula based on SCr of 1.37 mg/dL (H)).    Allergies  Allergen Reactions  . Flexeril [Cyclobenzaprine]     "generic flexeril", r/t bell's palsy    Thank you for allowing pharmacy to be a part of this patient's care.  Ulice Dash D 01/15/2021 1:31 PM

## 2021-01-15 NOTE — ED Notes (Signed)
Attempted IV x2 right arm without success.

## 2021-01-15 NOTE — ED Provider Notes (Signed)
Signout from Dr. Sedonia Small.  60 year old female diabetic here with altered mental status syncope vomiting for the past few days.  He is in DKA, hypothermic, acidotic.  Getting IV fluids insulin drip.  Will need admission to the hospital. Physical Exam  BP (!) 131/118   Pulse (!) 101   Temp (!) 94.3 F (34.6 C) (Rectal)   Resp (!) 27   Ht 5\' 9"  (1.753 m)   Wt 68 kg   LMP 09/17/2011   SpO2 100%   BMI 22.15 kg/m   Physical Exam  ED Course/Procedures     .Critical Care Performed by: Hayden Rasmussen, MD Authorized by: Hayden Rasmussen, MD   Critical care provider statement:    Critical care time (minutes):  45   Critical care time was exclusive of:  Separately billable procedures and treating other patients   Critical care was necessary to treat or prevent imminent or life-threatening deterioration of the following conditions:  Metabolic crisis   Critical care was time spent personally by me on the following activities:  Discussions with consultants, evaluation of patient's response to treatment, examination of patient, ordering and performing treatments and interventions, ordering and review of laboratory studies, pulse oximetry, re-evaluation of patient's condition, obtaining history from patient or surrogate and review of old charts   Care discussed with: accepting provider at another facility      MDM  Patient remains on fluids antibiotics insulin drip.  She is awake and alert.  She said she is feeling better and is thirsty.  We will start her on some ice chips.  Repeat VBG does not show much improvement in her pH 6.97.  Patient understands she needs to be admitted for further treatment.  Initially she was in agreement for a bed at [facility].  I discussed with [facility] hospitalist Dr. Marylyn Ishihara.  He thought she would be more appropriate for an ICU bed with her low pH.  Patient now tells me she would like to be transferred to [facility].  Discussed with [facility] line who states that they  do not have capacity to accept her to [facility] or [facility].  Reviewed with patient and her husband.  They are agreeable to [facility].  Have placed a call in for critical care at [facility].  10:45 AM.  Discussed with Dr. Tamala Julian critical care who will accept the patient under Dr. Loanne Drilling to [facility].  11:20 AM.  Discussed care with Dr. Loanne Drilling critical care.  She recommends 2 A of bicarb IV and a bicarb drip.  Also recommends more IV fluids.      Hayden Rasmussen, MD 01/15/21 [phone]

## 2021-01-15 NOTE — ED Notes (Signed)
Pt placed on [facility], EDP aware of temp

## 2021-01-15 NOTE — H&P (Signed)
NAME:  Nancy Camacho, MRN:  [id number], DOB:  03-20-1961, LOS: 0 ADMISSION DATE:  01/15/2021, CONSULTATION DATE: 2/20 REFERRING MD:  Sedonia Small, CHIEF COMPLAINT:  dka   Brief History:  60 year old female admitted with DKA  History of Present Illness:  Pleasant 60 year old female patient with insulin-dependent diabetes presented to the emergency room on 2/20 with delirium, increased work of breathing, and hyperglycemia. Diagnostic evaluation in the ER was consistent with diabetic ketoacidosis with profound metabolic acidosis and pH less than 7. In the emergency room she was started on IV insulin, administered fairly aggressive volume resuscitation and by the time she will arrived at [facility] long hospital she was awake, oriented and able to report history. Apparently About 4 days prior began to notice symptoms consistent with a sinus infection notably sinus congestion but otherwise felt okay. One of her healthcare providers had instructed her to hold her insulin and diabetic medications anytime she was sick so she did this. In the following day she developed progressive nausea vomiting and hyperglycemia leading to this hospital admission.  Past Medical History:  Diabetes, Bell's palsy, depression, chronic back pain, thyroid disease, anxiety.  Significant Hospital Events:   2/20 admitted. Initial blood glucose 459 White blood cell count 19 anion gap 28 serum creatinine 1.61 beta hydroxybutyric acid greater than 8 lactate 3.7 IV hydration administered, started on IV insulin Consults:    Procedures:    Significant Diagnostic Tests:    Micro Data:  SARS/COVID-19 2/20  Antimicrobials:  Ceftriaxone 2/20  Interim History / Subjective:  Feels better  Objective   Blood pressure 121/76, pulse (Abnormal) 126, temperature 97.7 F (36.5 C), temperature source Oral, resp. rate 18, height 5\' 9"  (1.753 m), weight 68 kg, last menstrual period 09/17/2011, SpO2 100 %.        Intake/Output Summary (Last  24 hours) at 01/15/2021 1245 Last data filed at 01/15/2021 1208 Gross per 24 hour  Intake 3640.7 ml  Output no documentation  Net 3640.7 ml   Filed Weights   01/15/21 0559  Weight: 68 kg    Examination: General: Otherwise healthy-appearing 60 year old female who is in no acute distress currently HENT: Normocephalic atraumatic no jugular venous distention Lungs: Clear throughout all fields no accessory use currently room air Cardiovascular: Regular rate and rhythm Abdomen: Soft not tender Extremities: Warm dry Neuro: Awake oriented no focal deficits GU: Voids spontaneously  Resolved Hospital Problem list   Resolved acute metabolic encephalopathy in the setting of DKA, this resolved in the emergency room prior to admission to the ICU  Assessment & Plan:   Severe metabolic acidosis with mix of both diabetic ketoacidosis and lactic acidosis is a consequence of marked volume depletion and hypovolemia -Has received aggressive volume resuscitation -Insulin drip started -Glucose normalizing from mid 400s down to 200 -Physical exam reassuring Plan Admit to ICU/stepdown Serial blood chemistries Continuing insulin drip until anion gap closed Serial beta hydroxybutyric acid Repeat lactic acid N.p.o. except clear liquids with no sugar Will be able to stop bicarb gtt later this afternoon  Diabetes with hyperglycemia Plan Insulin as above We will consult diabetic coordinator for further diabetic teaching to prepare her for sick days in the future   AKI secondary to volume depletion -Serum creatinine trending down Plan Continue IV hydration Serial chemistries  Possible sinusitis, still has fairly significant leukocytosis The symptoms have resolved Plan Continue Rocephin for now Follow-up COVID-19 PCR Check procalcitonin  History of hypothyroidism Plan Continue Synthroid    Best practice (evaluated daily)  Diet:  NPO Pain/Anxiety/Delirium protocol (if indicated):  na VAP protocol (if indicated): NA DVT prophylaxis: [place] heparin  GI prophylaxis: NA Glucose control insulin gtt  Mobility: OOB w/ assist  Disposition:icu stepdown   Goals of Care:  Last date of multidisciplinary goals of care discussion:pending Family and staff present: pending Summary of discussion: pending Follow up goals of care discussion due: pending  Code Status: full code4  Labs   CBC: Recent Labs  Lab 01/15/21 0602 01/15/21 0621  WBC 19.0*  --   HGB 15.5* 17.3*  HCT 52.1* 51.0*  MCV 102.0*  --   PLT 468*  --     Basic Metabolic Panel: Recent Labs  Lab 01/15/21 0602 01/15/21 0621 01/15/21 0847  NA 133* 133* 136  K 5.2* 5.5* 4.4  CL 99  --  109  CO2 6*  --  <7*  GLUCOSE 567*  --  373*  BUN 49*  --  45*  CREATININE 1.61*  --  1.37*  CALCIUM 9.6  --  8.8*   GFR: Estimated Creatinine Clearance: 47.3 mL/min (A) (by C-G formula based on SCr of 1.37 mg/dL (H)). Recent Labs  Lab 01/15/21 0602 01/15/21 0619  WBC 19.0*  --   LATICACIDVEN  --  3.7*    Liver Function Tests: Recent Labs  Lab 01/15/21 0602  AST 46*  ALT 33  ALKPHOS 100  BILITOT 1.0  PROT 8.1  ALBUMIN 4.5   No results for input(s): LIPASE, AMYLASE in the last 168 hours. No results for input(s): AMMONIA in the last 168 hours.  ABG    Component Value Date/Time   HCO3 4.8 (L) 01/15/2021 0621   TCO2 6 (L) 01/15/2021 0621   ACIDBASEDEF 29.0 (H) 01/15/2021 0621   O2SAT 51.0 01/15/2021 0621     Coagulation Profile: No results for input(s): INR, PROTIME in the last 168 hours.  Cardiac Enzymes: No results for input(s): CKTOTAL, CKMB, CKMBINDEX, TROPONINI in the last 168 hours.  HbA1C: Hgb A1c MFr Bld  Date/Time Value Ref Range Status  05/08/2013 10:01 AM 9.7 (H) <5.7 % Final    Comment:                                                                           According to the ADA Clinical Practice Recommendations for 2011, when HbA1c is used as a screening test:     >=6.5%    Diagnostic of Diabetes Mellitus            (if abnormal result is confirmed)   5.7-6.4%   Increased risk of developing Diabetes Mellitus   References:Diagnosis and Classification of Diabetes Mellitus,Diabetes JGGE,3662,94(TMLYY 1):S62-S69 and Standards of Medical Care in         Diabetes - 2011,Diabetes TKPT,4656,81 (Suppl 1):S11-S61.      CBG: Recent Labs  Lab 01/15/21 0818 01/15/21 0851 01/15/21 0958 01/15/21 1105 01/15/21 1200  GLUCAP 402* 367* 286* 233* 206*    Review of Systems:   Review of Systems  Constitutional: Negative for fever and malaise/fatigue.  HENT: Positive for sinus pain.   Eyes: Negative.   Respiratory: Negative.   Cardiovascular: Negative.   Gastrointestinal: Positive for vomiting.  Genitourinary: Negative.   Musculoskeletal: Negative.   Skin: Negative.  Neurological: Positive for dizziness and weakness.  Endo/Heme/Allergies: Negative.   Psychiatric/Behavioral: Negative.      Past Medical History:  She,  has a past medical history of Anxiety, Chronic back pain, Depression, Diabetes mellitus without complication ([place]), History of Bell's palsy, History of seborrhea, and Thyroid disease.   Surgical History:   Past Surgical History:  Procedure Laterality Date  . ABDOMINOPLASTY  07/2005  . BREAST ENHANCEMENT SURGERY  04/2006  . COLONOSCOPY  03/15/2014  . POLYPECTOMY    . TUBAL LIGATION       Social History:   reports that she has quit smoking. She has never used smokeless tobacco. She reports that she does not drink alcohol and does not use drugs.   Family History:  Her family history includes Diabetes in her father. There is no history of Colon cancer, Pancreatic cancer, Rectal cancer, Stomach cancer, Colon polyps, or Esophageal cancer.   Allergies Allergies  Allergen Reactions  . Flexeril [Cyclobenzaprine]     "generic flexeril", r/t bell's palsy     Home Medications  Prior to Admission medications   Medication Sig Start Date End  Date Taking? Authorizing Provider  ADDERALL XR 20 MG 24 hr capsule Take 20 mg by mouth every morning. 02/23/20   [provider]  BIOTIN FORTE PO Take by mouth. 2 in am    [provider]  empagliflozin (JARDIANCE) 25 MG TABS tablet Take 25 mg by mouth daily.    [provider]  estradiol (ESTRACE) 0.5 MG tablet estradiol 0.5 mg tablet  Take 1 tablet every day by oral route.    [provider]  HUMALOG KWIKPEN 100 UNIT/ML KwikPen  01/11/20   [provider]  hydrocortisone 2.5 % cream APPLY TO RASH SPARINGLY. 05/16/12   Elby Showers, MD  lamoTRIgine (LAMICTAL) 25 MG tablet Take 10 mg by mouth 2 (two) times daily. 2 at night     [provider]  levothyroxine (SYNTHROID) 75 MCG tablet Take 75 mcg by mouth daily. 02/25/20   [provider]  metFORMIN (GLUCOPHAGE) 500 MG tablet TAKE  (1)  TABLET TWICE A DAY WITH MEALS (BREAKFAST AND SUPPER) 06/30/13   Elby Showers, MD  methocarbamol (ROBAXIN) 500 MG tablet Take 500 mg by mouth 4 (four) times daily.    [provider]  ondansetron (ZOFRAN) 8 MG tablet ondansetron HCl 8 mg tablet    [provider]  terbinafine (LAMISIL) 250 MG tablet Take 1 tablet (250 mg total) by mouth daily. 11/15/20   Edrick Kins, DPM  TRESIBA FLEXTOUCH 100 UNIT/ML FlexTouch Pen  03/09/20   [provider]  TRULICITY 1.5 ZO/1.0RU SOPN SMARTSIG:0.5 Milliliter(s) SUB-Q Once a Week 02/20/20   [provider]  vortioxetine HBr (TRINTELLIX) 10 MG TABS tablet Trintellix 10 mg tablet  Take 1 tablet every day by oral route.    [provider]     Critical care time: 32 min     Erick Colace ACNP-BC [place] Pager # [phone] OR # [phone] if no answer

## 2021-01-15 NOTE — Progress Notes (Signed)
Date and time results received: 01/15/21 2243 (use smartphrase ".now" to insert current time)  Test: Troponin Critical Value: 189  Name of Provider Notified: [facility]  Orders Received? Or Actions Taken? None at this time.

## 2021-01-15 NOTE — ED Triage Notes (Addendum)
Brought in by husband with reports of LOC. Pt is diabetic. Pt alert on arrival and assisted to tx room in wheel chair. Pt able to answer questions but with eyes clothes and seems dyspneic at rest. RR 24. Pt states she has been vomiting since Friday 4-5x/day. She takes insulin and oral meds for diabetes.

## 2021-01-15 NOTE — Progress Notes (Signed)
Date and time results received: 01/15/2021 @ 1850   Test Critical Value: Troponin 162  Name of Provider Notified: Dr. Loanne Drilling   Orders Received? Repeat troponin check in 4 hrs

## 2021-01-15 NOTE — Progress Notes (Signed)
VBG ran via iSTAT. Critical values given to Dr. Melina Copa; pH 6.9; HCO3 4.3.

## 2021-01-15 NOTE — ED Notes (Signed)
Dr. Sedonia Small and Verline Lema, RN (pts primary RN) notified of critical lab results.

## 2021-01-15 NOTE — ED Notes (Addendum)
CRITICAL VALUE STICKER  CRITICAL VALUE: H CO3 of 4  RECEIVER (on-site recipient of call):  DATE & TIME NOTIFIED: 01-15-2021 7159  MESSENGER (representative from lab):Carol   MD NOTIFIED: Dr. Melina Copa  TIME OF NOTIFICATION:0954  RESPONSE: N/A

## 2021-01-15 NOTE — ED Notes (Signed)
Pt is alert to self but not location or year. Pt will follow commands and does not appear to be in distress. Family at bedside.

## 2021-01-15 NOTE — ED Notes (Signed)
LR bolus not given due to arrival of [facility] and other med admin, same given on report

## 2021-01-15 NOTE — ED Notes (Signed)
Patient wants to transfer to [facility] called the [facility] for transfer at 10:11

## 2021-01-15 NOTE — ED Notes (Signed)
ED Provider at bedside. 

## 2021-01-15 NOTE — Progress Notes (Signed)
[facility]Link Physician-Brief Progress Note Patient Name: Nancy Camacho DOB: [birth year] MRN: [id number]   Date of Service  01/15/2021  HPI/Events of Note  Cough  eICU Interventions  Plan: 1. Robitussin DM 10 mL PO Q 4 hours PRN cough.     Intervention Category Major Interventions: Other:  [name] 01/15/2021, 8:23 PM

## 2021-01-15 NOTE — ED Provider Notes (Signed)
[name] Emergency Department Provider Note MRN:  [id number]  Arrival date & time: 01/15/21     Chief Complaint   Loss of Consciousness   History of Present Illness   Nancy Camacho is a 60 y.o. year-old female with a history of diabetes presenting to the ED with chief complaint of vomiting.  Persistent nausea vomiting for the past few days.  Patient is altered, hyperglycemic.  I was unable to obtain an accurate HPI, PMH, or ROS due to the patient's altered mental status.  Level 5 caveat.  Review of Systems  Positive for nausea vomiting, altered mental status  Patient's Health History    Past Medical History:  Diagnosis Date  . Anxiety   . Chronic back pain   . Depression   . Diabetes mellitus without complication ([place])   . History of Bell's palsy   . History of seborrhea    ear  . Thyroid disease     Past Surgical History:  Procedure Laterality Date  . ABDOMINOPLASTY  07/2005  . BREAST ENHANCEMENT SURGERY  04/2006  . COLONOSCOPY  03/15/2014  . POLYPECTOMY    . TUBAL LIGATION      Family History  Problem Relation Age of Onset  . Diabetes Father   . Colon cancer Neg Hx   . Pancreatic cancer Neg Hx   . Rectal cancer Neg Hx   . Stomach cancer Neg Hx   . Colon polyps Neg Hx   . Esophageal cancer Neg Hx     Social History   Socioeconomic History  . Marital status: Married    Spouse name: Not on file  . Number of children: Not on file  . Years of education: Not on file  . Highest education level: Not on file  Occupational History  . Not on file  Tobacco Use  . Smoking status: Former Research scientist (life sciences)  . Smokeless tobacco: Never Used  Vaping Use  . Vaping Use: Never used  Substance and Sexual Activity  . Alcohol use: No  . Drug use: No  . Sexual activity: Yes    Birth control/protection: None  Other Topics Concern  . Not on file  Social History Narrative  . Not on file   Social Determinants of Health   Financial Resource  Strain: Not on file  Food Insecurity: Not on file  Transportation Needs: Not on file  Physical Activity: Not on file  Stress: Not on file  Social Connections: Not on file  Intimate Partner Violence: Not on file     Physical Exam   Vitals:   01/15/21 0640 01/15/21 0645  BP:  (!) 131/118  Pulse:  (!) 101  Resp:  (!) 27  Temp: (!) 94.3 F (34.6 C)   SpO2:  100%    CONSTITUTIONAL: Ill-appearing, mild distress due to nausea NEURO:  Alert and oriented x 3, no focal deficits EYES:  eyes equal and reactive ENT/NECK:  no LAD, no JVD CARDIO: Tachycardic rate, well-perfused, normal S1 and S2 PULM:  CTAB no wheezing or rhonchi GI/GU:  normal bowel sounds, non-distended, non-tender MSK/SPINE:  No gross deformities, no edema SKIN:  no rash, atraumatic PSYCH:  Appropriate speech and behavior  *Additional and/or pertinent findings included in MDM below  Diagnostic and Interventional Summary    EKG Interpretation  Date/Time:  Sunday January 15 2021 06:09:59 EST Ventricular Rate:  104 PR Interval:    QRS Duration: 108 QT Interval:  351 QTC Calculation: 462 R Axis:   -  47 Text Interpretation: Sinus tachycardia Biatrial enlargement LAD, consider left anterior fascicular block Low voltage, precordial leads Probable anteroseptal infarct, old Confirmed by Gerlene Fee [phone]) on 01/15/2021 6:33:03 AM      Labs Reviewed  CBC - Abnormal; Notable for the following components:      Result Value   WBC 19.0 (*)    Hemoglobin 15.5 (*)    HCT 52.1 (*)    MCV 102.0 (*)    MCHC 29.8 (*)    Platelets 468 (*)    All other components within normal limits  COMPREHENSIVE METABOLIC PANEL - Abnormal; Notable for the following components:   Sodium 133 (*)    Potassium 5.2 (*)    CO2 6 (*)    Glucose, Bld 567 (*)    BUN 49 (*)    Creatinine, Ser 1.61 (*)    AST 46 (*)    GFR, Estimated 37 (*)    Anion gap 28 (*)    All other components within normal limits  LACTIC ACID, PLASMA - Abnormal;  Notable for the following components:   Lactic Acid, Venous 3.7 (*)    All other components within normal limits  CBG MONITORING, ED - Abnormal; Notable for the following components:   Glucose-Capillary 459 (*)    All other components within normal limits  I-STAT VENOUS BLOOD GAS, ED - Abnormal; Notable for the following components:   pH, Ven 6.840 (*)    pCO2, Ven 28.2 (*)    pO2, Ven 48.0 (*)    Bicarbonate 4.8 (*)    TCO2 6 (*)    Acid-base deficit 29.0 (*)    Sodium 133 (*)    Potassium 5.5 (*)    HCT 51.0 (*)    Hemoglobin 17.3 (*)    All other components within normal limits  URINALYSIS, ROUTINE W REFLEX MICROSCOPIC  PREGNANCY, URINE  BETA-HYDROXYBUTYRIC ACID  BETA-HYDROXYBUTYRIC ACID  BETA-HYDROXYBUTYRIC ACID    DG Chest Port 1 View    (Results Pending)    Medications  insulin regular, human (MYXREDLIN) 100 units/ 100 mL infusion (10 Units/hr Intravenous New Bag/Given 01/15/21 0656)  lactated ringers infusion (has no administration in time range)  dextrose 5 % in lactated ringers infusion (has no administration in time range)  dextrose 50 % solution 0-50 mL (has no administration in time range)  ondansetron (ZOFRAN) injection 4 mg (4 mg Intravenous Given 01/15/21 0621)  lactated ringers bolus 2,000 mL (1,000 mLs Intravenous New Bag/Given 01/15/21 6160)     Procedures  /  Critical Care .Critical Care Performed by: Maudie Flakes, MD Authorized by: Maudie Flakes, MD   Critical care provider statement:    Critical care time (minutes):  32   Critical care was necessary to treat or prevent imminent or life-threatening deterioration of the following conditions:  Metabolic crisis (Diabetic ketoacidosis)   Critical care was time spent personally by me on the following activities:  Discussions with consultants, evaluation of patient's response to treatment, examination of patient, ordering and performing treatments and interventions, ordering and review of laboratory studies,  ordering and review of radiographic studies, pulse oximetry, re-evaluation of patient's condition, obtaining history from patient or surrogate and review of old charts    ED Course and Medical Decision Making  I have reviewed the triage vital signs, the nursing notes, and pertinent available records from the EMR.  Listed above are laboratory and imaging tests that I personally ordered, reviewed, and interpreted and then considered in my medical decision  making (see below for details).  Suspicious for DKA, awaiting laboratory evaluation.  Patient seems dehydrated, providing IV fluids, nausea medicine.  Also considering metabolic disarray, overall low concern for significant or emergent intra-abdominal process given the benign exam.     VBG is concerning with a pH of 6.8, significant base deficit consistent with suspected DKA.  Providing LR bolus, starting insulin drip.  Patient is hemodynamically stable, altered but maintaining airway, will admit to ICU versus stepdown depending on how she responds to management.  Signed out to oncoming provider at shift change.  Barth Kirks. Sedonia Small, [place] [email or web address]  Final Clinical Impressions(s) / ED Diagnoses     ICD-10-CM   1. Diabetic ketoacidosis without coma associated with other specified diabetes mellitus ([place])  E13.10     ED Discharge Orders    None       Discharge Instructions Discussed with and Provided to Patient:   Discharge Instructions   None       Maudie Flakes, MD 01/15/21 [phone]

## 2021-01-15 NOTE — Progress Notes (Signed)
eLink Physician-Brief Progress Note Patient Name: Nancy Camacho DOB: February 09, 1961 MRN: [id number]   Date of Service  01/15/2021  HPI/Events of Note  Troponin = 162 --> 189. Demand ischemia?  eICU Interventions  Plan: 1. 12 Lead EKG STAT. 2. ASA 81 mg PO now and Q day.  3. Continue to cycle Troponin.      Intervention Category Major Interventions: Other:  [name] 01/15/2021, 10:59 PM

## 2021-01-16 ENCOUNTER — Inpatient Hospital Stay (HOSPITAL_COMMUNITY): Payer: 59

## 2021-01-16 ENCOUNTER — Encounter (HOSPITAL_COMMUNITY): Payer: Self-pay | Admitting: Pulmonary Disease

## 2021-01-16 DIAGNOSIS — R9431 Abnormal electrocardiogram [ECG] [EKG]: Secondary | ICD-10-CM

## 2021-01-16 DIAGNOSIS — U071 COVID-19: Secondary | ICD-10-CM | POA: Diagnosis not present

## 2021-01-16 DIAGNOSIS — J329 Chronic sinusitis, unspecified: Secondary | ICD-10-CM

## 2021-01-16 DIAGNOSIS — G9341 Metabolic encephalopathy: Secondary | ICD-10-CM | POA: Diagnosis not present

## 2021-01-16 DIAGNOSIS — I248 Other forms of acute ischemic heart disease: Secondary | ICD-10-CM

## 2021-01-16 DIAGNOSIS — R778 Other specified abnormalities of plasma proteins: Secondary | ICD-10-CM

## 2021-01-16 DIAGNOSIS — R7989 Other specified abnormal findings of blood chemistry: Secondary | ICD-10-CM | POA: Diagnosis not present

## 2021-01-16 DIAGNOSIS — N179 Acute kidney failure, unspecified: Secondary | ICD-10-CM

## 2021-01-16 DIAGNOSIS — E876 Hypokalemia: Secondary | ICD-10-CM

## 2021-01-16 DIAGNOSIS — E1110 Type 2 diabetes mellitus with ketoacidosis without coma: Secondary | ICD-10-CM | POA: Diagnosis not present

## 2021-01-16 LAB — ECHOCARDIOGRAM LIMITED
Area-P 1/2: 3.93 cm2
Height: 69 in
S' Lateral: 2.5 cm
Weight: 2324.53 oz

## 2021-01-16 LAB — BASIC METABOLIC PANEL
Anion gap: 13 (ref 5–15)
Anion gap: 15 (ref 5–15)
BUN: 26 mg/dL — ABNORMAL HIGH (ref 6–20)
BUN: 23 mg/dL — ABNORMAL HIGH (ref 6–20)
CO2: 23 mmol/L (ref 22–32)
CO2: 24 mmol/L (ref 22–32)
Calcium: 8 mg/dL — ABNORMAL LOW (ref 8.9–10.3)
Calcium: 8.5 mg/dL — ABNORMAL LOW (ref 8.9–10.3)
Chloride: 103 mmol/L (ref 98–111)
Chloride: 101 mmol/L (ref 98–111)
Creatinine, Ser: 0.87 mg/dL (ref 0.44–1.00)
Creatinine, Ser: 0.71 mg/dL (ref 0.44–1.00)
GFR, Estimated: >60 mL/min (ref >60)
GFR, Estimated: >60 mL/min (ref >60)
Glucose, Bld: 160 mg/dL — ABNORMAL HIGH (ref 70–99)
Glucose, Bld: 163 mg/dL — ABNORMAL HIGH (ref 70–99)
Potassium: 3.1 mmol/L — ABNORMAL LOW (ref 3.5–5.1)
Potassium: 3.4 mmol/L — ABNORMAL LOW (ref 3.5–5.1)
Sodium: 139 mmol/L (ref 135–145)
Sodium: 140 mmol/L (ref 135–145)

## 2021-01-16 LAB — TROPONIN I (HIGH SENSITIVITY)
Troponin I (High Sensitivity): 196 ng/L (ref <18)
Troponin I (High Sensitivity): 130 ng/L (ref <18)

## 2021-01-16 LAB — I-STAT VENOUS BLOOD GAS, ED
Acid-base deficit: 26 mmol/L — ABNORMAL HIGH (ref 0.0–2.0)
Bicarbonate: 4.3 mmol/L — ABNORMAL LOW (ref 20.0–28.0)
Calcium, Ion: 1.37 mmol/L (ref 1.15–1.40)
HCT: 46 % (ref 36.0–46.0)
Hemoglobin: 15.6 g/dL — ABNORMAL HIGH (ref 12.0–15.0)
O2 Saturation: 65 %
Patient temperature: 97.6
Potassium: 4.5 mmol/L (ref 3.5–5.1)
Sodium: 139 mmol/L (ref 135–145)
TCO2: <5 mmol/L — ABNORMAL LOW (ref 22–32)
pCO2, Ven: 18 mmHg — CL (ref 44.0–60.0)
pH, Ven: 6.981 — CL (ref 7.250–7.430)
pO2, Ven: 49 mmHg — ABNORMAL HIGH (ref 32.0–45.0)

## 2021-01-16 LAB — GLUCOSE, CAPILLARY
Glucose-Capillary: 131 mg/dL — ABNORMAL HIGH (ref 70–99)
Glucose-Capillary: 136 mg/dL — ABNORMAL HIGH (ref 70–99)
Glucose-Capillary: 144 mg/dL — ABNORMAL HIGH (ref 70–99)
Glucose-Capillary: 149 mg/dL — ABNORMAL HIGH (ref 70–99)
Glucose-Capillary: 156 mg/dL — ABNORMAL HIGH (ref 70–99)
Glucose-Capillary: 160 mg/dL — ABNORMAL HIGH (ref 70–99)
Glucose-Capillary: 164 mg/dL — ABNORMAL HIGH (ref 70–99)
Glucose-Capillary: 204 mg/dL — ABNORMAL HIGH (ref 70–99)

## 2021-01-16 LAB — CBC
HCT: 36.3 % (ref 36.0–46.0)
Hemoglobin: 12.1 g/dL (ref 12.0–15.0)
MCH: 30.3 pg (ref 26.0–34.0)
MCHC: 33.3 g/dL (ref 30.0–36.0)
MCV: 91 fL (ref 80.0–100.0)
Platelets: 237 10*3/uL (ref 150–400)
RBC: 3.99 MIL/uL (ref 3.87–5.11)
RDW: 14.3 % (ref 11.5–15.5)
WBC: 9.4 10*3/uL (ref 4.0–10.5)
nRBC: 0 % (ref 0.0–0.2)

## 2021-01-16 LAB — HEMOGLOBIN A1C
Hgb A1c MFr Bld: 7.7 % — ABNORMAL HIGH (ref 4.8–5.6)
Mean Plasma Glucose: 174.29 mg/dL

## 2021-01-16 LAB — TSH: TSH: 1.04 u[IU]/mL (ref 0.350–4.500)

## 2021-01-16 LAB — HEPARIN LEVEL (UNFRACTIONATED)
Heparin Unfractionated: 0.42 IU/mL (ref 0.30–0.70)
Heparin Unfractionated: 0.38 IU/mL (ref 0.30–0.70)

## 2021-01-16 LAB — PROCALCITONIN: Procalcitonin: 1.03 ng/mL

## 2021-01-16 MED ORDER — CALCIUM GLUCONATE-NACL 2-0.675 GM/100ML-% IV SOLN
2.0000 g | Freq: Once | INTRAVENOUS | Status: AC
  Administered 2021-01-16: 2000 mg via INTRAVENOUS
  Filled 2021-01-16: qty 100

## 2021-01-16 MED ORDER — LIP MEDEX EX OINT
TOPICAL_OINTMENT | CUTANEOUS | Status: AC
  Filled 2021-01-16: qty 7

## 2021-01-16 MED ORDER — INSULIN ASPART 100 UNIT/ML ~~LOC~~ SOLN
0.0000 [IU] | SUBCUTANEOUS | Status: DC
  Administered 2021-01-16: 5 [IU] via SUBCUTANEOUS
  Administered 2021-01-16: 2 [IU] via SUBCUTANEOUS
  Administered 2021-01-16: 3 [IU] via SUBCUTANEOUS
  Administered 2021-01-17: 2 [IU] via SUBCUTANEOUS
  Administered 2021-01-17: 3 [IU] via SUBCUTANEOUS
  Administered 2021-01-17: 2 [IU] via SUBCUTANEOUS
  Administered 2021-01-17 (×2): 3 [IU] via SUBCUTANEOUS
  Administered 2021-01-18: 2 [IU] via SUBCUTANEOUS
  Administered 2021-01-18: 3 [IU] via SUBCUTANEOUS
  Administered 2021-01-18: 8 [IU] via SUBCUTANEOUS

## 2021-01-16 MED ORDER — HEPARIN (PORCINE) 25000 UT/250ML-% IV SOLN
800.0000 [IU]/h | INTRAVENOUS | Status: DC
  Administered 2021-01-16: 800 [IU]/h via INTRAVENOUS
  Filled 2021-01-16 (×2): qty 250

## 2021-01-16 MED ORDER — METOPROLOL TARTRATE 5 MG/5ML IV SOLN
2.5000 mg | Freq: Four times a day (QID) | INTRAVENOUS | Status: DC
  Administered 2021-01-16 – 2021-01-18 (×9): 2.5 mg via INTRAVENOUS
  Filled 2021-01-16 (×9): qty 5

## 2021-01-16 MED ORDER — POTASSIUM CHLORIDE 10 MEQ/100ML IV SOLN
10.0000 meq | INTRAVENOUS | Status: AC
  Administered 2021-01-16 (×6): 10 meq via INTRAVENOUS
  Filled 2021-01-16 (×6): qty 100

## 2021-01-16 MED ORDER — INSULIN GLARGINE 100 UNIT/ML ~~LOC~~ SOLN
10.0000 [IU] | Freq: Every day | SUBCUTANEOUS | Status: DC
  Administered 2021-01-16 – 2021-01-18 (×3): 10 [IU] via SUBCUTANEOUS
  Filled 2021-01-16 (×3): qty 0.1

## 2021-01-16 MED ORDER — ACETAMINOPHEN 325 MG PO TABS
650.0000 mg | ORAL_TABLET | ORAL | Status: DC | PRN
  Administered 2021-01-16 – 2021-01-17 (×4): 650 mg via ORAL
  Filled 2021-01-16 (×4): qty 2

## 2021-01-16 NOTE — Hospital Course (Signed)
Nancy Camacho is a 60 yo female with PMH DMII, hypothyroidism, depression/anxiety, chronic back pain who initially presented to the ER with confusion, increased work of breathing, and hyperglycemia.  She was found to be positive for COVID-19 as well as work-up consistent with DKA.  She was transferred to [facility] and initiated on DKA protocol. She responded well to fluids/bicarb drip and insulin drip which was able to be transitioned back to subcutaneous insulins. She was considered asymptomatic from a Covid standpoint and remained on room air therefore did not meet criteria for initiating on treatment.  She was transferred to the hospitalist service from [facility] on 01/16/2021. Throughout her work-up, initial troponin was checked and found to be elevated and was further trended which peaked at 196.  There were questionable EKG changes and cardiology was consulted.  She denied complaints of chest pain.

## 2021-01-16 NOTE — Assessment & Plan Note (Signed)
-   3.4 this am - replete and recheck

## 2021-01-16 NOTE — Progress Notes (Signed)
  Echocardiogram 2D Echocardiogram has been performed.  [name] 01/16/2021, 2:32 PM

## 2021-01-16 NOTE — Assessment & Plan Note (Signed)
-  Patient was considered asymptomatic on admission and remained on room air.  Was not started on any treatment -Continue supportive care

## 2021-01-16 NOTE — Assessment & Plan Note (Signed)
-  Etiology may be due to recent illness with sinusitis and positive covid test; she had also stopped her home insulin due to feeling bad -DKA has resolved with bicarb drip and insulin drip -Transition back to subcutaneous insulin -Resume carb consistent diet

## 2021-01-16 NOTE — Progress Notes (Signed)
ANTICOAGULATION CONSULT NOTE - Initial Consult  Pharmacy Consult for Heparin Indication: chest pain/ACS  Allergies  Allergen Reactions  . Flexeril [Cyclobenzaprine] Other (See Comments)    "generic flexeril", r/t bell's palsy    Patient Measurements: Height: 5\' 9"  (175.3 cm) Weight: 65.9 kg (145 lb 4.5 oz) IBW/kg (Calculated) : 66.2 Heparin Dosing Weight: actual body weight  Vital Signs: Temp: 98.4 F (36.9 C) (02/21 0000) Temp Source: Oral (02/21 0000) BP: 101/55 (02/21 0000) Pulse Rate: 108 (02/21 0100)  Labs: Recent Labs    01/15/21 0602 01/15/21 0621 01/15/21 0847 01/15/21 1400 01/15/21 1719 01/15/21 2032 01/16/21 0030  HGB 15.5* 17.3*  --  13.3  --   --   --   HCT 52.1* 51.0*  --  42.7  --   --   --   PLT 468*  --   --  291  --   --   --   CREATININE 1.61*  --    < > 1.15* 0.92 1.00 0.87  TROPONINIHS  --   --   --   --  162* 189* 196*   < > = values in this interval not displayed.    Estimated Creatinine Clearance: 74.2 mL/min (by C-G formula based on SCr of 0.87 mg/dL).   Medical History: Past Medical History:  Diagnosis Date  . Anxiety   . Chronic back pain   . Depression   . Diabetes mellitus without complication ([place])   . History of Bell's palsy   . History of seborrhea    ear  . Thyroid disease     Medications:  No anticoagulation PTA During hospitalization: heparin 5000 units sq q8h for VTE prophylaxis   Assessment:  60 yr female admitted with DKA  Patient with elevated troponin x 3  Pharmacy consulted to dose heparin for ACS/STEMI  Goal of Therapy:  Heparin level 0.3-0.7 units/ml Monitor platelets by anticoagulation protocol: Yes   Plan:   No bolus (pt received heparin 5000 units sq @ 21:55 on 01/15/21)  Begin IV heparin gtt @ 800 units/hr  Check heparin level 6 hr after IV heparin started  Monitor heparin level and CBC daily while on heparin  Monitor for signs/symptoms of bleeding  Everette Rank,  PharmD 01/16/2021,3:01 AM

## 2021-01-16 NOTE — Progress Notes (Signed)
ANTICOAGULATION CONSULT NOTE - Follow Up Consult  Pharmacy Consult for Heparin Indication: chest pain/ACS  Allergies  Allergen Reactions  . Flexeril [Cyclobenzaprine] Other (See Comments)    "generic flexeril", r/t bell's palsy    Patient Measurements: Height: 5\' 9"  (175.3 cm) Weight: 65.9 kg (145 lb 4.5 oz) IBW/kg (Calculated) : 66.2 Heparin Dosing Weight: TBW, 66kg  Vital Signs: Temp: 98.8 F (37.1 C) (02/21 0400) Temp Source: Oral (02/21 0400) BP: 108/58 (02/21 0600) Pulse Rate: 109 (02/21 0600)  Labs: Recent Labs    01/15/21 0602 01/15/21 0621 01/15/21 0847 01/15/21 1400 01/15/21 1719 01/15/21 2032 01/16/21 0030 01/16/21 0642  HGB 15.5* 17.3*  --  13.3  --   --   --  12.1  HCT 52.1* 51.0*  --  42.7  --   --   --  36.3  PLT 468*  --   --  291  --   --   --  237  CREATININE 1.61*  --    < > 1.15* 0.92 1.00 0.87  --   TROPONINIHS  --   --   --   --  162* 189* 196*  --    < > = values in this interval not displayed.    Estimated Creatinine Clearance: 74.2 mL/min (by C-G formula based on SCr of 0.87 mg/dL).   Medications:  Infusions:  . cefTRIAXone (ROCEPHIN)  IV    . dextrose 5% lactated ringers Stopped (01/15/21 1203)  . heparin 800 Units/hr (01/16/21 0455)  . insulin 1.3 mL/hr at 01/16/21 0455  . lactated ringers Stopped (01/15/21 1128)  . potassium chloride 10 mEq (01/16/21 0700)    Assessment: 12 yoF admitted on 2/20 with DKA, noted to have elevated troponins x3.  Pharmacy is consulted to dose Heparin for ACS.  Today, 01/16/2021: Heparin level 0.42, therapeutic on heparin 800 unit/hr CBC: Hgb and Plt wnl/stable No Bleeding or IV line problems reported by RN.   Goal of Therapy:  Heparin level 0.3-0.7 units/ml Monitor platelets by anticoagulation protocol: Yes   Plan:   Continue heparin IV infusion at 800 units/hr  Confirmatory Heparin level in 6 hours  Daily heparin level and CBC   Gretta Arab PharmD, BCPS Clinical Pharmacist [facility]  main pharmacy [phone] 01/16/2021 7:42 AM

## 2021-01-16 NOTE — Assessment & Plan Note (Signed)
-   baseline creatinine ~ 0.6 - patient presents with increase in creat >0.3 mg/dL above baseline, creat increase >1.5x baseline presumed to have occurred within past 7 days PTA - creat 1.37 on admission - responded well to IVF

## 2021-01-16 NOTE — Progress Notes (Signed)
PROGRESS NOTE    Nancy Camacho   YYT:[id number]  DOB: [birth year]  DOA: 01/15/2021     1  PCP: Reynold Bowen, MD  CC: hyperglycemia  Hospital Course: Nancy Camacho is a 60 yo female with PMH DMII, hypothyroidism, depression/anxiety, chronic back pain who initially presented to the ER with confusion, increased work of breathing, and hyperglycemia.  She was found to be positive for COVID-19 as well as work-up consistent with DKA.  She was transferred to [facility] and initiated on DKA protocol. She responded well to fluids/bicarb drip and insulin drip which was able to be transitioned back to subcutaneous insulins. She was considered asymptomatic from a Covid standpoint and remained on room air therefore did not meet criteria for initiating on treatment.  She was transferred to the hospitalist service from [facility] on 01/16/2021. Throughout her work-up, initial troponin was checked and found to be elevated and was further trended which peaked at 196.  There were questionable EKG changes and cardiology was consulted.  She denied complaints of chest pain.   Interval History:  Seen this morning resting in bed.  She denies any chest pain and otherwise feels much improved and almost back to normal.  Breathing is also comfortable and she remains on room air. We discussed transitioning off the insulin drip back to subcutaneous insulin and seeking cardiology consult due to her elevated troponins.  ROS: Constitutional: negative for chills, fatigue and fevers, Respiratory: negative for cough and dyspnea on exertion, Cardiovascular: negative for chest pain and Gastrointestinal: negative for abdominal pain  Assessment & Plan: Elevated troponin -Troponin elevated on admission and has peaked at what looks to be 196.  Possible EKG changes and patient was started on heparin drip - continue asa, lopressor, heparin drip - follow up lipid panel - cardiology consulted; patient is CP free; this may be due to demand  from covid/dka however may still warrant stress testing or cath; will follow up cardiology rec's  DKA (diabetic ketoacidosis) (HCC)-resolved as of 01/16/2021 -Etiology may be due to recent illness with sinusitis and positive covid test; she had also stopped her home insulin due to feeling bad -DKA has resolved with bicarb drip and insulin drip -Transition back to subcutaneous insulin -Resume carb consistent diet  AKI (acute kidney injury) (HCC)-resolved as of 01/16/2021 - baseline creatinine ~ 0.6 - patient presents with increase in creat >0.3 mg/dL above baseline, creat increase >1.5x baseline presumed to have occurred within past 7 days PTA - creat 1.37 on admission - responded well to IVF  Hypokalemia - 3.4 this am - replete and recheck   Sinusitis - continue rocephin   COVID-19 virus infection -Patient was considered asymptomatic on admission and remained on room air.  Was not started on any treatment -Continue supportive care  Type 2 diabetes mellitus without complication ([place]) - start on Lantus; adjust as needed -Continue SSI and CBG monitoring -A1c 7.7%    Old records reviewed in assessment of this patient  Antimicrobials: Rocephin 2/20>> current  DVT prophylaxis: heparin drip    Code Status:   Code Status: Full Code Family Communication: none present  Disposition Plan: Status is: Inpatient  Remains inpatient appropriate because:Ongoing diagnostic testing needed not appropriate for outpatient work up, IV treatments appropriate due to intensity of illness or inability to take PO and Inpatient level of care appropriate due to severity of illness   Dispo: The patient is from: Home              Anticipated  d/c is to: Home              Anticipated d/c date is: 2 days              Patient currently is not medically stable to d/c.   Difficult to place patient No  Risk of unplanned readmission score: Unplanned Admission- Pilot do not use: 10.29    Objective: Blood pressure (!) 108/58, pulse (!) 109, temperature 98.3 F (36.8 C), temperature source Oral, resp. rate 14, height 5\' 9"  (1.753 m), weight 65.9 kg, last menstrual period 09/17/2011, SpO2 99 %.  Examination: General appearance: alert, cooperative and no distress Head: Normocephalic, without obvious abnormality, atraumatic Eyes: EOMI Lungs: clear to auscultation bilaterally Heart: regular rate and rhythm and S1, S2 normal Abdomen: soft, NT, ND, BS present Extremities: no edema Skin: mobility and turgor normal Neurologic: Grossly normal  Consultants:   Cardiology  Procedures:     Data Reviewed: I have personally reviewed following labs and imaging studies Results for orders placed or performed during the hospital encounter of 01/15/21 (from the past 24 hour(s))  Beta-hydroxybutyric acid     Status: Abnormal   Collection Time: 01/15/21  2:00 PM  Result Value Ref Range   Beta-Hydroxybutyric Acid 6.21 (H) 0.05 - 0.27 mmol/L  MRSA PCR Screening     Status: None   Collection Time: 01/15/21  2:00 PM   Specimen: Nasopharyngeal  Result Value Ref Range   MRSA by PCR NEGATIVE NEGATIVE  HIV Antibody (routine testing w rflx)     Status: None   Collection Time: 01/15/21  2:00 PM  Result Value Ref Range   HIV Screen 4th Generation wRfx Non Reactive Non Reactive  CBC     Status: Abnormal   Collection Time: 01/15/21  2:00 PM  Result Value Ref Range   WBC 15.5 (H) 4.0 - 10.5 K/uL   RBC 4.44 3.87 - 5.11 MIL/uL   Hemoglobin 13.3 12.0 - 15.0 g/dL   HCT 42.7 36.0 - 46.0 %   MCV 96.2 80.0 - 100.0 fL   MCH 30.0 26.0 - 34.0 pg   MCHC 31.1 30.0 - 36.0 g/dL   RDW 14.1 11.5 - 15.5 %   Platelets 291 150 - 400 K/uL   nRBC 0.0 0.0 - 0.2 %  Basic metabolic panel     Status: Abnormal   Collection Time: 01/15/21  2:00 PM  Result Value Ref Range   Sodium 143 135 - 145 mmol/L   Potassium 4.2 3.5 - 5.1 mmol/L   Chloride 109 98 - 111 mmol/L   CO2 15 (L) 22 - 32 mmol/L   Glucose,  Bld 196 (H) 70 - 99 mg/dL   BUN 36 (H) 6 - 20 mg/dL   Creatinine, Ser 1.15 (H) 0.44 - 1.00 mg/dL   Calcium 8.5 (L) 8.9 - 10.3 mg/dL   GFR, Estimated 56 (L) >60 mL/min   Anion gap 19 (H) 5 - 15  Hemoglobin A1c     Status: Abnormal   Collection Time: 01/15/21  2:00 PM  Result Value Ref Range   Hgb A1c MFr Bld 7.7 (H) 4.8 - 5.6 %   Mean Plasma Glucose 174.29 mg/dL  Magnesium     Status: None   Collection Time: 01/15/21  2:00 PM  Result Value Ref Range   Magnesium 1.8 1.7 - 2.4 mg/dL  Phosphorus     Status: Abnormal   Collection Time: 01/15/21  2:00 PM  Result Value Ref Range   Phosphorus 1.4 (  L) 2.5 - 4.6 mg/dL  Procalcitonin - Baseline     Status: None   Collection Time: 01/15/21  2:00 PM  Result Value Ref Range   Procalcitonin 1.38 ng/mL  Glucose, capillary     Status: Abnormal   Collection Time: 01/15/21  2:04 PM  Result Value Ref Range   Glucose-Capillary 166 (H) 70 - 99 mg/dL  Blood gas, arterial     Status: Abnormal   Collection Time: 01/15/21  2:24 PM  Result Value Ref Range   FIO2 21.00    pH, Arterial 7.391 7.350 - 7.450   pCO2 arterial 23.9 (L) 32.0 - 48.0 mmHg   pO2, Arterial 84.3 83.0 - 108.0 mmHg   Bicarbonate 14.2 (L) 20.0 - 28.0 mmol/L   Acid-base deficit 8.9 (H) 0.0 - 2.0 mmol/L   O2 Saturation 97.9 %   Patient temperature 98.6    Allens test (pass/fail) PASS PASS  Glucose, capillary     Status: Abnormal   Collection Time: 01/15/21  3:01 PM  Result Value Ref Range   Glucose-Capillary 158 (H) 70 - 99 mg/dL  Glucose, capillary     Status: Abnormal   Collection Time: 01/15/21  4:06 PM  Result Value Ref Range   Glucose-Capillary 167 (H) 70 - 99 mg/dL  Glucose, capillary     Status: Abnormal   Collection Time: 01/15/21  5:00 PM  Result Value Ref Range   Glucose-Capillary 136 (H) 70 - 99 mg/dL  Basic metabolic panel     Status: Abnormal   Collection Time: 01/15/21  5:19 PM  Result Value Ref Range   Sodium 141 135 - 145 mmol/L   Potassium 3.7 3.5 - 5.1  mmol/L   Chloride 106 98 - 111 mmol/L   CO2 19 (L) 22 - 32 mmol/L   Glucose, Bld 147 (H) 70 - 99 mg/dL   BUN 31 (H) 6 - 20 mg/dL   Creatinine, Ser 0.92 0.44 - 1.00 mg/dL   Calcium 8.6 (L) 8.9 - 10.3 mg/dL   GFR, Estimated >60 >60 mL/min   Anion gap 16 (H) 5 - 15  Lipase, blood     Status: None   Collection Time: 01/15/21  5:19 PM  Result Value Ref Range   Lipase 27 11 - 51 U/L  Troponin I (High Sensitivity)     Status: Abnormal   Collection Time: 01/15/21  5:19 PM  Result Value Ref Range   Troponin I (High Sensitivity) 162 (HH) <18 ng/L  Glucose, capillary     Status: Abnormal   Collection Time: 01/15/21  6:03 PM  Result Value Ref Range   Glucose-Capillary 124 (H) 70 - 99 mg/dL   Comment 1 Notify RN    Comment 2 Document in Chart   Glucose, capillary     Status: Abnormal   Collection Time: 01/15/21  7:04 PM  Result Value Ref Range   Glucose-Capillary 125 (H) 70 - 99 mg/dL  Glucose, capillary     Status: Abnormal   Collection Time: 01/15/21  7:59 PM  Result Value Ref Range   Glucose-Capillary 119 (H) 70 - 99 mg/dL  Basic metabolic panel     Status: Abnormal   Collection Time: 01/15/21  8:32 PM  Result Value Ref Range   Sodium 140 135 - 145 mmol/L   Potassium 4.2 3.5 - 5.1 mmol/L   Chloride 103 98 - 111 mmol/L   CO2 19 (L) 22 - 32 mmol/L   Glucose, Bld 132 (H) 70 - 99 mg/dL  BUN 29 (H) 6 - 20 mg/dL   Creatinine, Ser 1.00 0.44 - 1.00 mg/dL   Calcium 8.4 (L) 8.9 - 10.3 mg/dL   GFR, Estimated >60 >60 mL/min   Anion gap 18 (H) 5 - 15  Troponin I (High Sensitivity)     Status: Abnormal   Collection Time: 01/15/21  8:32 PM  Result Value Ref Range   Troponin I (High Sensitivity) 189 (HH) <18 ng/L  Beta-hydroxybutyric acid     Status: Abnormal   Collection Time: 01/15/21  8:32 PM  Result Value Ref Range   Beta-Hydroxybutyric Acid >8.00 (H) 0.05 - 0.27 mmol/L  Glucose, capillary     Status: Abnormal   Collection Time: 01/15/21  9:00 PM  Result Value Ref Range    Glucose-Capillary 129 (H) 70 - 99 mg/dL  Glucose, capillary     Status: Abnormal   Collection Time: 01/15/21  9:54 PM  Result Value Ref Range   Glucose-Capillary 144 (H) 70 - 99 mg/dL  Glucose, capillary     Status: Abnormal   Collection Time: 01/15/21 11:05 PM  Result Value Ref Range   Glucose-Capillary 156 (H) 70 - 99 mg/dL  Glucose, capillary     Status: Abnormal   Collection Time: 01/16/21 12:05 AM  Result Value Ref Range   Glucose-Capillary 160 (H) 70 - 99 mg/dL  Basic metabolic panel     Status: Abnormal   Collection Time: 01/16/21 12:30 AM  Result Value Ref Range   Sodium 139 135 - 145 mmol/L   Potassium 3.1 (L) 3.5 - 5.1 mmol/L   Chloride 103 98 - 111 mmol/L   CO2 23 22 - 32 mmol/L   Glucose, Bld 160 (H) 70 - 99 mg/dL   BUN 26 (H) 6 - 20 mg/dL   Creatinine, Ser 0.87 0.44 - 1.00 mg/dL   Calcium 8.0 (L) 8.9 - 10.3 mg/dL   GFR, Estimated >60 >60 mL/min   Anion gap 13 5 - 15  Troponin I (High Sensitivity)     Status: Abnormal   Collection Time: 01/16/21 12:30 AM  Result Value Ref Range   Troponin I (High Sensitivity) 196 (HH) <18 ng/L  Glucose, capillary     Status: Abnormal   Collection Time: 01/16/21  1:00 AM  Result Value Ref Range   Glucose-Capillary 144 (H) 70 - 99 mg/dL  Glucose, capillary     Status: Abnormal   Collection Time: 01/16/21  2:58 AM  Result Value Ref Range   Glucose-Capillary 149 (H) 70 - 99 mg/dL  Glucose, capillary     Status: Abnormal   Collection Time: 01/16/21  5:05 AM  Result Value Ref Range   Glucose-Capillary 136 (H) 70 - 99 mg/dL  Procalcitonin     Status: None   Collection Time: 01/16/21  6:42 AM  Result Value Ref Range   Procalcitonin 1.03 ng/mL  CBC     Status: None   Collection Time: 01/16/21  6:42 AM  Result Value Ref Range   WBC 9.4 4.0 - 10.5 K/uL   RBC 3.99 3.87 - 5.11 MIL/uL   Hemoglobin 12.1 12.0 - 15.0 g/dL   HCT 36.3 36.0 - 46.0 %   MCV 91.0 80.0 - 100.0 fL   MCH 30.3 26.0 - 34.0 pg   MCHC 33.3 30.0 - 36.0 g/dL   RDW  14.3 11.5 - 15.5 %   Platelets 237 150 - 400 K/uL   nRBC 0.0 0.0 - 0.2 %  Basic metabolic panel     Status:  Abnormal   Collection Time: 01/16/21  6:42 AM  Result Value Ref Range   Sodium 140 135 - 145 mmol/L   Potassium 3.4 (L) 3.5 - 5.1 mmol/L   Chloride 101 98 - 111 mmol/L   CO2 24 22 - 32 mmol/L   Glucose, Bld 163 (H) 70 - 99 mg/dL   BUN 23 (H) 6 - 20 mg/dL   Creatinine, Ser 0.71 0.44 - 1.00 mg/dL   Calcium 8.5 (L) 8.9 - 10.3 mg/dL   GFR, Estimated >60 >60 mL/min   Anion gap 15 5 - 15  Hemoglobin A1c     Status: Abnormal   Collection Time: 01/16/21  6:42 AM  Result Value Ref Range   Hgb A1c MFr Bld 7.7 (H) 4.8 - 5.6 %   Mean Plasma Glucose 174.29 mg/dL  Glucose, capillary     Status: Abnormal   Collection Time: 01/16/21  6:56 AM  Result Value Ref Range   Glucose-Capillary 131 (H) 70 - 99 mg/dL  Glucose, capillary     Status: Abnormal   Collection Time: 01/16/21  9:21 AM  Result Value Ref Range   Glucose-Capillary 156 (H) 70 - 99 mg/dL   Comment 1 Notify RN    Comment 2 Document in Chart   Heparin level (unfractionated)     Status: None   Collection Time: 01/16/21 10:25 AM  Result Value Ref Range   Heparin Unfractionated 0.42 0.30 - 0.70 IU/mL  TSH     Status: None   Collection Time: 01/16/21 10:25 AM  Result Value Ref Range   TSH 1.040 0.350 - 4.500 uIU/mL  Troponin I (High Sensitivity)     Status: Abnormal   Collection Time: 01/16/21 10:25 AM  Result Value Ref Range   Troponin I (High Sensitivity) 130 (HH) <18 ng/L  Glucose, capillary     Status: Abnormal   Collection Time: 01/16/21 11:13 AM  Result Value Ref Range   Glucose-Capillary 164 (H) 70 - 99 mg/dL    Recent Results (from the past 240 hour(s))  SARS CORONAVIRUS 2 (TAT 6-24 HRS) Nasopharyngeal Nasopharyngeal Swab     Status: Abnormal   Collection Time: 01/15/21  9:27 AM   Specimen: Nasopharyngeal Swab  Result Value Ref Range Status   SARS Coronavirus 2 POSITIVE (A) NEGATIVE Final    Comment:  (NOTE) SARS-CoV-2 target nucleic acids are DETECTED.  The SARS-CoV-2 RNA is generally detectable in upper and lower respiratory specimens during the acute phase of infection. Positive results are indicative of the presence of SARS-CoV-2 RNA. Clinical correlation with patient history and other diagnostic information is  necessary to determine patient infection status. Positive results do not rule out bacterial infection or co-infection with other viruses.  The expected result is Negative.  Fact Sheet for Patients: SugarRoll.be  Fact Sheet for Healthcare Providers: https://www.woods-mathews.com/  This test is not yet approved or cleared by the [place] FDA and  has been authorized for detection and/or diagnosis of SARS-CoV-2 by FDA under an Emergency Use Authorization (EUA). This EUA will remain  in effect (meaning this test can be used) for the duration of the COVID-19 declaration under Section 564(b)(1) of the Act, 21 U. S.C. section 360bbb-3(b)(1), unless the authorization is terminated or revoked sooner.   Performed at [facility], [place] [address], [place], [place] [address]   MRSA PCR Screening     Status: None   Collection Time: 01/15/21  2:00 PM   Specimen: Nasopharyngeal  Result Value Ref Range Status  MRSA by PCR NEGATIVE NEGATIVE Final    Comment:        The GeneXpert MRSA Assay (FDA approved for NASAL specimens only), is one component of a comprehensive MRSA colonization surveillance program. It is not intended to diagnose MRSA infection nor to guide or monitor treatment for MRSA infections. Performed at [facility], [place] [address], [place], [place] [address]      Radiology Studies: [facility] Chest Port 1 View  Result Date: 01/15/2021 CLINICAL DATA:  Loss of consciousness. EXAM: PORTABLE CHEST 1 VIEW COMPARISON:  None FINDINGS: Normal heart size and mediastinal contours. No acute infiltrate or  edema. No effusion or pneumothorax. No acute osseous findings. Artifact from EKG leads. IMPRESSION: Negative chest. Electronically Signed   By: Monte Fantasia M.D.   On: 01/15/2021 07:27   DG Chest Port 1 View  Final Result      Scheduled Meds: . amphetamine-dextroamphetamine  20 mg Oral q morning  . aspirin  81 mg Oral Daily  . Chlorhexidine Gluconate Cloth  6 each Topical Daily  . estradiol  0.5 mg Oral Daily  . insulin aspart  0-15 Units Subcutaneous Q4H  . insulin glargine  10 Units Subcutaneous Daily  . levothyroxine  75 mcg Oral Q0600  . metoprolol tartrate  2.5 mg Intravenous Q6H  . vortioxetine HBr  10 mg Oral Daily   PRN Meds: dextrose, guaiFENesin-dextromethorphan Continuous Infusions: . cefTRIAXone (ROCEPHIN)  IV Stopped (01/16/21 1044)  . heparin 800 Units/hr (01/16/21 0455)     LOS: 1 day  Time spent: Greater than 50% of the 35 minute visit was spent in counseling/coordination of care for the patient as laid out in the A&P.   Dwyane Dee, MD [facility] 01/16/2021, 1:06 PM

## 2021-01-16 NOTE — Consult Note (Addendum)
Cardiology Consultation:   Patient ID: Nancy Camacho MRN: [id number]; DOB: 1961-01-07  Admit date: 01/15/2021 Date of Consult: 01/16/2021  PCP:  Reynold Bowen, Lindsey  Cardiologist:  No primary care provider on file. (new) Advanced Practice Provider:  No care team member to display Electrophysiologist:  None  [id number]}   Patient Profile:   Nancy Camacho is a 60 y.o. female with a hx of DM, Bell's palsy, chronic back pain, depression, thyroid disease and anxiety who is being seen today for the evaluation of elevated troponin at the request of Dr. Sabino Gasser.  History of Present Illness:   Ms. Nancy Camacho has no known prior cardiac history. She presented to the ED yesterday with persistent nausea/vomiting for several days, delirium/altered mental status, and severe hyperglycemia. She had recently developed symptoms on 2/16 she felt were due to a sinus infection so had reportedly self-discontinued her insulin due to the acute illness. 2 days later she developed the nausea and vomiting. On arrival she was found to be acidotic and hypothermic with DKA and tested positive for Covid on admission. No known sick contacts. Had 1 dose of Pfizer in July 2021. Labs also revealed leukocytosis, elevated lactic acid and AKI with Cr 1.61. She was started on IV fluids, insulin and bicarb with steady improvement. hsTroponins were drawn with result [id number]. Initial EKG on arrival showed sinus tach 103bpm, left axis deviation, anteroseptal Q waves with baseline ST upsloping V1-V2. F/u EKG last night (ordered due to troponin elevation) showed sinus tach 113bpm, left axis deviation, baseline wander making interpretation challenging, subtle ST elevation V1-V2 with coved appearance in V3 and anteroseptal Q waves. She denies any recent or current chest pain or dyspnea (but was noted to be dysnpneic on arrival to the hospital per notes). She does not formally exercise but reports she is very active  at baseline "on the go" and does not historically get any symptoms consistent with cardiac limitation. She denies any h/o syncope.  Past Medical History:  Diagnosis Date  . Anxiety   . Chronic back pain   . Depression   . Diabetes mellitus without complication ([place])   . History of Bell's palsy   . History of seborrhea    ear  . Thyroid disease     Past Surgical History:  Procedure Laterality Date  . ABDOMINOPLASTY  07/2005  . BREAST ENHANCEMENT SURGERY  04/2006  . COLONOSCOPY  03/15/2014  . POLYPECTOMY    . TUBAL LIGATION       Home Medications:  Prior to Admission medications   Medication Sig Start Date End Date Taking? Authorizing Provider  acetaminophen (TYLENOL) 500 MG tablet Take 1,000 mg by mouth every 6 (six) hours as needed for headache (pain).   Yes [provider]  ADDERALL XR 20 MG 24 hr capsule Take 20 mg by mouth every morning. 02/23/20  Yes [provider]  busPIRone (BUSPAR) 15 MG tablet Take 15 mg by mouth every morning. 01/05/21  Yes [provider]  celecoxib (CELEBREX) 200 MG capsule Take 200 mg by mouth every morning. 12/01/20  Yes [provider]  empagliflozin (JARDIANCE) 25 MG TABS tablet Take 25 mg by mouth daily.   Yes [provider]  estradiol-norethindrone (ACTIVELLA) 1-0.5 MG tablet Take 1 tablet by mouth at bedtime. 11/28/20  Yes [provider]  insulin degludec (TRESIBA FLEXTOUCH) 100 UNIT/ML FlexTouch Pen Inject 17 Units into the skin every morning.   Yes [provider]  insulin lispro (HUMALOG) 100 UNIT/ML KwikPen Inject 2-3 Units into the skin 3 (three) times daily with meals.   Yes [provider]  lamoTRIgine (LAMICTAL) 25 MG tablet Take 25 mg by mouth at bedtime.   Yes [provider]  levothyroxine (SYNTHROID) 75 MCG tablet Take 75 mcg by mouth every morning. 02/25/20  Yes [provider]  MAGNESIUM-ZINC PO Take 1 tablet by mouth at bedtime.   Yes [provider]  metFORMIN (GLUCOPHAGE) 1000 MG tablet Take 1,000 mg by mouth 2 (two) times daily. 11/18/20  Yes [provider]  Multiple Vitamins-Minerals (ZINC PO) Take 1 capsule by mouth at bedtime.   Yes [provider]  ondansetron (ZOFRAN-ODT) 8 MG disintegrating tablet Take 4-8 mg by mouth 2 (two) times daily. 01/13/21  Yes [provider]  Semaglutide, 1 MG/DOSE, (OZEMPIC, 1 MG/DOSE,) 4 MG/3ML SOPN Inject 1 mg into the skin every Monday.   Yes [provider]  terbinafine (LAMISIL) 250 MG tablet Take 1 tablet (250 mg total) by mouth daily. Patient taking differently: Take 250 mg by mouth every morning. 11/15/20  Yes Edrick Kins, DPM  Varenicline Tartrate (TYRVAYA) 0.03 MG/ACT SOLN Place 1 spray into both nostrils 2 (two) times daily.   Yes [provider]  Vitamin D, Ergocalciferol, (DRISDOL) 1.25 MG (50000 UNIT) CAPS capsule Take 50,000 Units by mouth every Tuesday.   Yes [provider]  vortioxetine HBr (TRINTELLIX) 20 MG TABS tablet Take 20 mg by mouth every morning.   Yes [provider]  estradiol (ESTRACE) 0.5 MG tablet Take 0.5 mg by mouth daily. Patient not taking: No sig reported    [provider]  vortioxetine HBr (TRINTELLIX) 10 MG TABS tablet Trintellix 10 mg tablet  Take 1 tablet every day by oral route. Patient not taking: No sig reported    [provider]    Inpatient Medications: Scheduled Meds: . amphetamine-dextroamphetamine  20 mg Oral q morning  . aspirin  81 mg Oral Daily  . Chlorhexidine Gluconate Cloth  6 each Topical Daily  . estradiol  0.5 mg Oral Daily  . insulin aspart  0-15 Units Subcutaneous Q4H  . insulin glargine  10 Units Subcutaneous Daily  . levothyroxine  75 mcg Oral Q0600  . metoprolol tartrate  2.5 mg Intravenous Q6H  . vortioxetine HBr  10 mg Oral Daily   Continuous Infusions: . cefTRIAXone (ROCEPHIN)  IV    . heparin 800 Units/hr (01/16/21 0455)  .  potassium chloride 10 mEq (01/16/21 0923)   PRN Meds: dextrose, guaiFENesin-dextromethorphan  Allergies:    Allergies  Allergen Reactions  . Flexeril [Cyclobenzaprine] Other (See Comments)    "generic flexeril", r/t bell's palsy    Social History:   Social History   Socioeconomic History  . Marital status: Married    Spouse name: Not on file  . Number of children: Not on file  . Years of education: Not on file  . Highest education level: Not on file  Occupational History  . Not on file  Tobacco Use  . Smoking status: Never Smoker  . Smokeless tobacco: Never Used  Vaping Use  . Vaping Use: Never used  Substance and Sexual Activity  . Alcohol use: No  . Drug use: No  . Sexual activity: Yes    Birth control/protection: None  Other Topics Concern  . Not on file  Social History Narrative  . Not on file   Social Determinants of Health   Financial Resource Strain:  Not on file  Food Insecurity: Not on file  Transportation Needs: Not on file  Physical Activity: Not on file  Stress: Not on file  Social Connections: Not on file  Intimate Partner Violence: Not on file    Family History:    Family History  Problem Relation Age of Onset  . Diabetes Father   . Atrial fibrillation Mother   . Colon cancer Neg Hx   . Pancreatic cancer Neg Hx   . Rectal cancer Neg Hx   . Stomach cancer Neg Hx   . Colon polyps Neg Hx   . Esophageal cancer Neg Hx      ROS:  Please see the history of present illness.  All other ROS reviewed and negative.     Physical Exam/Data:   Vitals:   01/16/21 0300 01/16/21 0400 01/16/21 0500 01/16/21 0600  BP:  115/75  (!) 108/58  Pulse: (!) 112 (!) 103 (!) 106 (!) 109  Resp: 18 15 16 14   Temp:  98.8 F (37.1 C)    TempSrc:  Oral    SpO2: 97% 98% 99% 99%  Weight:      Height:        Intake/Output Summary (Last 24 hours) at 01/16/2021 0950 Last data filed at 01/16/2021 0503 Gross per 24 hour  Intake 3337.06 ml  Output 1850 ml  Net  1487.06 ml   Last 3 Weights 01/15/2021 01/15/2021 04/01/2020  Weight (lbs) 145 lb 4.5 oz 150 lb 155 lb  Weight (kg) 65.9 kg 68.04 kg 70.308 kg     Body mass index is 21.45 kg/m.  General: Well developed, well nourished, in no acute distress. Head: Normocephalic, atraumatic, sclera non-icteric, no xanthomas, nares are without discharge. Neck: Negative for carotid bruits. JVP not elevated. Lungs: Clear bilaterally to auscultation without wheezes, rales, or rhonchi. Breathing is unlabored. Heart: Reg rhythm and borderline tachycardic S1 S2 without murmurs, rubs, or gallops.  Abdomen: Soft, non-tender, non-distended with normoactive bowel sounds. No rebound/guarding. Extremities: No clubbing or cyanosis. No edema. Distal pedal pulses are 2+ and equal bilaterally. Neuro: Alert and oriented X 3. Moves all extremities spontaneously. Psych:  Responds to questions appropriately with a normal affect.   EKG:  The EKG was personally reviewed and demonstrates:   06:09 sinus tach 103bpm, left axis deviation, anteroseptal Q waves with baseline ST upsloping V1-V2 23:07 sinus tach 113bpm, left axis deviation, baseline wander making interpretation challenging, subtle ST elevation V1-V2 with coved appearance in V3 and anteroseptal Q waves This AM: sinus tach 108bpm, left axis deviation, anteroseptal Q waves, do not appreciate the ST depression previously seen on last tracing  Telemetry:  Telemetry was personally reviewed and demonstrates:  Sinus tach  Relevant CV Studies: n/a  Laboratory Data:  High Sensitivity Troponin:   Recent Labs  Lab 01/15/21 1719 01/15/21 2032 01/16/21 0030  TROPONINIHS 162* 189* 196*     Chemistry Recent Labs  Lab 01/15/21 2032 01/16/21 0030 01/16/21 0642  NA 140 139 140  K 4.2 3.1* 3.4*  CL 103 103 101  CO2 19* 23 24  GLUCOSE 132* 160* 163*  BUN 29* 26* 23*  CREATININE 1.00 0.87 0.71  CALCIUM 8.4* 8.0* 8.5*  GFRNONAA >60 >60 >60  ANIONGAP 18* 13 15     Recent Labs  Lab 01/15/21 0602  PROT 8.1  ALBUMIN 4.5  AST 46*  ALT 33  ALKPHOS 100  BILITOT 1.0   Hematology Recent Labs  Lab 01/15/21 0602 01/15/21 0621 01/15/21 1400 01/16/21 5170  WBC 19.0*  --  15.5* 9.4  RBC 5.11  --  4.44 3.99  HGB 15.5* 17.3* 13.3 12.1  HCT 52.1* 51.0* 42.7 36.3  MCV 102.0*  --  96.2 91.0  MCH 30.3  --  30.0 30.3  MCHC 29.8*  --  31.1 33.3  RDW 14.2  --  14.1 14.3  PLT 468*  --  291 237   BNPNo results for input(s): BNP, PROBNP in the last 168 hours.  DDimer No results for input(s): DDIMER in the last 168 hours.   Radiology/Studies:  [facility] Chest Port 1 View  Result Date: 01/15/2021 CLINICAL DATA:  Loss of consciousness. EXAM: PORTABLE CHEST 1 VIEW COMPARISON:  None FINDINGS: Normal heart size and mediastinal contours. No acute infiltrate or edema. No effusion or pneumothorax. No acute osseous findings. Artifact from EKG leads. IMPRESSION: Negative chest. Electronically Signed   By: Monte Fantasia M.D.   On: 01/15/2021 07:27     Assessment and Plan:   1. Severe metabolic acidosis with DKA/lactic acidosis and acute encephalopathy in the context of recent nausea, vomiting, and sinus symptoms with Covid + infection and patient self-discontinuation of insulin - per primary team - mentation improved, n/v resolved  - normal oxygenation  2. AKI secondary to hypovolemia - improved, last Cr normalized at 0.71, primary team managing hypokalemia  3. Elevated troponin/abnormal EKG - 2nd EKG showed very subtle ST elevations in V1-V2 with convcave appearance, not appreciated on f/u tracing this AM (reviewed 2nd EKG with MD) - patient denies any symptoms consistent with ACS either recent or current - will follow up troponin level to trend - continue ASA, IV BB, and IV heparin for now - check lipids and trend LFTs in AM, consider addition of statin - check echo - further recs pending review of the above and d/w MD  4. Hypothyroidism - TSH pending  given ongoing tachycardia   Risk Assessment/Risk Scores:      Not clear at this time if elevated troponin represents ACS or demand process.  For questions or updates, please contact [place] Please consult [email or web address] for contact info under    Signed, Charlie Pitter, PA-C  01/16/2021 9:50 AM   Patient seen and examined.  Agree with above documentation.  Ms. Urbanek is a 60 year old female with a history of T2DM, Bell's palsy, thyroid disease who we are consulted to see at the request of Dr. Sabino Gasser for evaluation of troponin elevation.  She reported that on 2/16 she started having symptoms concerning for sinus infection, so she stopped taking her insulin.  Subsequently began having nausea and vomiting on 2/18.  She ultimately came to the ED yesterday with altered mental status.  Initial vital signs notable for BP 166/100, pulse 105, SPO2 100% on room air, respiratory rate 24.  Labs showed glucose 567, creatinine 1.6 (up from 0.6 on last check), potassium 5.2, sodium 133, lactate 3.7, WBC 19, hemoglobin 15.5, platelets 468, anion gap 28.  Troponin 162 >189 > 196 > 130.  She was started on insulin drip for DKA.  EKG yesterday showed sinus tachycardia, rate 104, Q waves in V1/2 with subtle ST elevation.  EKG today shows sinus tachycardia, rate 108, no ST elevation.  Echocardiogram personally reviewed and shows normal LV function.  Telemetry personally reviewed and shows sinus tachycardia.  On exam, patient is alert and oriented, regular rhythm, tachycardic, no murmurs, diminished breath sounds, no LE edema.   For her EKG changes and troponin elevation, given lack of anginal symptoms  and mild troponin elevation and no wall motion abnormalities on echo, suspect demand ischemia in setting of acute illness.  Can discontinue heparin drip.  No further cardiac work-up recommended at this time.  Donato Heinz, MD

## 2021-01-16 NOTE — Progress Notes (Signed)
eLink Physician-Brief Progress Note Patient Name: Nancy Camacho DOB: Jul 27, 1961 MRN: [id number]   Date of Service  01/16/2021  HPI/Events of Note  Troponin = 162 --> 189 --> 196. EKG reveals sinus tachycardia. Left axis deviation. Possible Anteroseptal infarct , age undetermined.   eICU Interventions  Plan: 1. Metoprolol 2.5 mg IV Q 6 hours hold dose for SBP < 100 or HR < 60. 2. Heparin IV infusion per pharmacy consult.      Intervention Category Major Interventions: Other:  [name] 01/16/2021, 2:33 AM

## 2021-01-16 NOTE — TOC Initial Note (Signed)
Transition of Care [facility]) - Initial/Assessment Note    Patient Details  Name: Nancy Camacho MRN: [id number] Date of Birth: [birth year]  [facility]) CM/SW Contact:    Leeroy Cha, RN Phone Number: 01/16/2021, 8:25 AM  Clinical Narrative:                 60 year old female with IDDM who [place] with shortness of breath and hyperglycemia who transferred to [facility] for DKA. Labs significant for pH 6.8. Prior to transfer she was treated with insulin gtt, sodium bicarbonate. Patient reports having a sinus infection and discontinuing her insulin due to illness. She developed n/v and worsening hyperglycemia and presented to ED.    S: Arrives to [employer]. Awake alert and oriented to self. Reports nausea and emesis has resolved with treatment so far. No shortness of breath     Blood pressure  (!) 113/57 , pulse  (!) 109 , temperature 97.9 F (36.6 C), temperature source Oral, resp. rate  (!) 23 , height 5\' 9"  (1.753 m), weight 65.9 kg, last menstrual period 09/17/2011, SpO2 100 %.   On exam, non-toxic appearing female, no acute distress, AAOx4. Lungs CTAB, no wheezing. Heart mild tachycardia, regular rate. Abdomen non-tender, soft, +BS.   PLAN: to return to home with husband,   Expected Discharge Plan: Home/Self Care Barriers to Discharge: Continued Medical Work up   Patient Goals and CMS Choice Patient states their goals for this hospitalization and ongoing recovery are:: to go home CMS Medicare.gov Compare Post Acute Care list provided to:: Patient    Expected Discharge Plan and Services Expected Discharge Plan: Home/Self Care   Discharge Planning Services: CM Consult   Living arrangements for the past 2 months: Single Family Home                                      Prior Living Arrangements/Services Living arrangements for the past 2 months: Single Family Home Lives with:: Spouse Patient language and need for interpreter reviewed::  Yes Do you feel safe going back to the place where you live?: Yes      Need for Family Participation in Patient Care: Yes (Comment) Care giver support system in place?: Yes (comment)   Criminal Activity/Legal Involvement Pertinent to Current Situation/Hospitalization: No - Comment as needed  Activities of Daily Living Home Assistive Devices/Equipment: CBG Meter ADL Screening (condition at time of admission) Patient's cognitive ability adequate to safely complete daily activities?: Yes Is the patient deaf or have difficulty hearing?: No Does the patient have difficulty seeing, even when wearing glasses/contacts?: No Does the patient have difficulty concentrating, remembering, or making decisions?: No Patient able to express need for assistance with ADLs?: Yes Does the patient have difficulty dressing or bathing?: Yes Independently performs ADLs?: Yes (appropriate for developmental age) Communication: Independent Dressing (OT): Independent Grooming: Independent Feeding: Independent Bathing: Independent Toileting: Independent In/Out Bed: Independent Walks in Home: Independent Does the patient have difficulty walking or climbing stairs?: No Weakness of Legs: None Weakness of Arms/Hands: None  Permission Sought/Granted                  Emotional Assessment Appearance:: Appears stated age Attitude/Demeanor/Rapport: Engaged Affect (typically observed): Calm Orientation: : Oriented to Situation,Oriented to  Time,Oriented to Place,Oriented to Self Alcohol / Substance Use: Not Applicable Psych Involvement: No (comment)  Admission diagnosis:  DKA (diabetic ketoacidosis) (  [place]) [E11.10] Diabetic ketoacidosis without coma associated with other specified diabetes mellitus ([place]) [E13.10] Patient Active Problem List   Diagnosis Date Noted  . DKA (diabetic ketoacidosis) ([place]) 01/15/2021  . Type II or unspecified type diabetes mellitus without mention of complication, uncontrolled  05/11/2013  . Depression 09/30/2011  . Menopause 09/30/2011  . Vitamin D deficiency 09/30/2011  . Back pain 09/30/2011   PCP:  Reynold Bowen, MD Pharmacy:   [facility], [place] [address] Phone: [phone] Fax: [facility], [address], [address] [place], [place] [address] Phone: [phone] Fax: [phone]     Social Determinants of Health (SDOH) Interventions    Readmission Risk Interventions No flowsheet data found.

## 2021-01-16 NOTE — Assessment & Plan Note (Signed)
-  Troponin elevated on admission and has peaked at what looks to be 196.  Possible EKG changes and patient was started on heparin drip - continue asa, lopressor, heparin drip - follow up lipid panel - cardiology consulted; patient is CP free; this may be due to demand from covid/dka however may still warrant stress testing or cath; will follow up cardiology rec's

## 2021-01-16 NOTE — Assessment & Plan Note (Signed)
continue rocephin.  ?

## 2021-01-16 NOTE — Assessment & Plan Note (Signed)
-   start on Lantus; adjust as needed -Continue SSI and CBG monitoring -A1c 7.7%

## 2021-01-17 DIAGNOSIS — E876 Hypokalemia: Secondary | ICD-10-CM | POA: Diagnosis not present

## 2021-01-17 DIAGNOSIS — U071 COVID-19: Secondary | ICD-10-CM | POA: Diagnosis not present

## 2021-01-17 DIAGNOSIS — R778 Other specified abnormalities of plasma proteins: Secondary | ICD-10-CM | POA: Diagnosis not present

## 2021-01-17 DIAGNOSIS — E1110 Type 2 diabetes mellitus with ketoacidosis without coma: Secondary | ICD-10-CM | POA: Diagnosis not present

## 2021-01-17 LAB — HEPATIC FUNCTION PANEL
ALT: 49 U/L — ABNORMAL HIGH (ref 0–44)
AST: 57 U/L — ABNORMAL HIGH (ref 15–41)
Albumin: 3.1 g/dL — ABNORMAL LOW (ref 3.5–5.0)
Alkaline Phosphatase: 46 U/L (ref 38–126)
Bilirubin, Direct: 0.1 mg/dL (ref 0.0–0.2)
Indirect Bilirubin: 0.9 mg/dL (ref 0.3–0.9)
Total Bilirubin: 1 mg/dL (ref 0.3–1.2)
Total Protein: 5.3 g/dL — ABNORMAL LOW (ref 6.5–8.1)

## 2021-01-17 LAB — CBC
HCT: 36.3 % (ref 36.0–46.0)
Hemoglobin: 12.3 g/dL (ref 12.0–15.0)
MCH: 30.3 pg (ref 26.0–34.0)
MCHC: 33.9 g/dL (ref 30.0–36.0)
MCV: 89.4 fL (ref 80.0–100.0)
Platelets: 211 10*3/uL (ref 150–400)
RBC: 4.06 MIL/uL (ref 3.87–5.11)
RDW: 14.3 % (ref 11.5–15.5)
WBC: 5.2 10*3/uL (ref 4.0–10.5)
nRBC: 0 % (ref 0.0–0.2)

## 2021-01-17 LAB — BASIC METABOLIC PANEL
Anion gap: 11 (ref 5–15)
BUN: 14 mg/dL (ref 6–20)
CO2: 25 mmol/L (ref 22–32)
Calcium: 8.1 mg/dL — ABNORMAL LOW (ref 8.9–10.3)
Chloride: 100 mmol/L (ref 98–111)
Creatinine, Ser: 0.53 mg/dL (ref 0.44–1.00)
GFR, Estimated: >60 mL/min (ref >60)
Glucose, Bld: 96 mg/dL (ref 70–99)
Potassium: 2.9 mmol/L — ABNORMAL LOW (ref 3.5–5.1)
Sodium: 136 mmol/L (ref 135–145)

## 2021-01-17 LAB — LIPID PANEL
Cholesterol: 135 mg/dL (ref 0–200)
HDL: 47 mg/dL (ref >40)
LDL Cholesterol: 75 mg/dL (ref 0–99)
Total CHOL/HDL Ratio: 2.9 RATIO
Triglycerides: 67 mg/dL (ref <150)
VLDL: 13 mg/dL (ref 0–40)

## 2021-01-17 LAB — PROCALCITONIN: Procalcitonin: 0.42 ng/mL

## 2021-01-17 LAB — GLUCOSE, CAPILLARY
Glucose-Capillary: 121 mg/dL — ABNORMAL HIGH (ref 70–99)
Glucose-Capillary: 139 mg/dL — ABNORMAL HIGH (ref 70–99)
Glucose-Capillary: 142 mg/dL — ABNORMAL HIGH (ref 70–99)
Glucose-Capillary: 151 mg/dL — ABNORMAL HIGH (ref 70–99)
Glucose-Capillary: 152 mg/dL — ABNORMAL HIGH (ref 70–99)
Glucose-Capillary: 182 mg/dL — ABNORMAL HIGH (ref 70–99)
Glucose-Capillary: 198 mg/dL — ABNORMAL HIGH (ref 70–99)
Glucose-Capillary: 80 mg/dL (ref 70–99)

## 2021-01-17 LAB — CALCIUM, IONIZED: Calcium, Ionized, Serum: 4.9 mg/dL (ref 4.5–5.6)

## 2021-01-17 LAB — MAGNESIUM: Magnesium: 1.7 mg/dL (ref 1.7–2.4)

## 2021-01-17 MED ORDER — POTASSIUM CHLORIDE 10 MEQ/100ML IV SOLN
10.0000 meq | INTRAVENOUS | Status: AC
  Administered 2021-01-17 (×2): 10 meq via INTRAVENOUS
  Filled 2021-01-17 (×2): qty 100

## 2021-01-17 MED ORDER — ENOXAPARIN SODIUM 40 MG/0.4ML ~~LOC~~ SOLN
40.0000 mg | SUBCUTANEOUS | Status: DC
  Administered 2021-01-17: 40 mg via SUBCUTANEOUS
  Filled 2021-01-17: qty 0.4

## 2021-01-17 MED ORDER — POTASSIUM CHLORIDE CRYS ER 20 MEQ PO TBCR
40.0000 meq | EXTENDED_RELEASE_TABLET | Freq: Once | ORAL | Status: AC
  Administered 2021-01-17: 40 meq via ORAL
  Filled 2021-01-17: qty 2

## 2021-01-17 MED ORDER — ONDANSETRON HCL 4 MG/2ML IJ SOLN
INTRAMUSCULAR | Status: AC
  Filled 2021-01-17: qty 2

## 2021-01-17 MED ORDER — ONDANSETRON HCL 4 MG/2ML IJ SOLN
4.0000 mg | Freq: Four times a day (QID) | INTRAMUSCULAR | Status: DC | PRN
  Administered 2021-01-17 – 2021-01-18 (×2): 4 mg via INTRAVENOUS
  Filled 2021-01-17: qty 2

## 2021-01-17 NOTE — Progress Notes (Signed)
F/u arranged 02/20/21, appt info placed on AVS.

## 2021-01-17 NOTE — Progress Notes (Signed)
Given lack of anginal symptoms and mild troponin elevation and no wall motion abnormalities on echo, suspect troponin elevation represents demand ischemia in setting of acute illness.  No further cardiac work-up recommended at this time.  [facility] will sign off.   Medication Recommendations: No changes Other recommendations (labs, testing, etc):  None Follow up as an outpatient:  Follow-up scheduled for 3/28

## 2021-01-17 NOTE — Progress Notes (Signed)
Inpatient Diabetes Program Recommendations  AACE/ADA: New Consensus Statement on Inpatient Glycemic Control (2015)  Target Ranges:  Prepandial:   less than 140 mg/dL      Peak postprandial:   less than 180 mg/dL (1-2 hours)      Critically ill patients:  140 - 180 mg/dL   Lab Results  Component Value Date   GLUCAP 198 (H) 01/17/2021   HGBA1C 7.7 (H) 01/16/2021    Review of Glycemic Control  Diabetes history: DM2 Outpatient Diabetes medications: Tresiba 17 units QAM, Humalog 2-3 units TID with meals, Jardiance 25 mg QD, metformin 1000 mg BID, Ozempic 1 mg Q weekly Current orders for Inpatient glycemic control: Lantus 10 units QD, Novolog 0-15 units Q4H  HgbA1C - 7.7%  Inpatient Diabetes Program Recommendations:     Add Novolog 2 units TID with meals for CHO coverage, if pt eating at least 50% meal.  Will attempt to call pt to speak with her about HgbA1C and taking insulin even if feeling sick.  Continue to follow.  Thank you. Lorenda Peck, RD, LDN, CDE Inpatient Diabetes Coordinator [phone]

## 2021-01-17 NOTE — Progress Notes (Signed)
PROGRESS NOTE    Nancy Camacho   EUM:[id number]  DOB: 1961/06/07  DOA: 01/15/2021     2  PCP: Reynold Bowen, MD  CC: hyperglycemia  Hospital Course: Nancy Camacho is a 60 yo female with PMH DMII, hypothyroidism, depression/anxiety, chronic back pain who initially presented to the ER with confusion, increased work of breathing, and hyperglycemia.  She was found to be positive for COVID-19 as well as work-up consistent with DKA.  She was transferred to [facility] and initiated on DKA protocol. She responded well to fluids/bicarb drip and insulin drip which was able to be transitioned back to subcutaneous insulins. She was considered asymptomatic from a Covid standpoint and remained on room air therefore did not meet criteria for initiating on treatment.  She was transferred to the hospitalist service from [facility] on 01/16/2021. Throughout her work-up, initial troponin was checked and found to be elevated and was further trended which peaked at 196.  There were questionable EKG changes and cardiology was consulted.  She denied complaints of chest pain.   Interval History:  Reported an episode of nausea and vomiting this a.m., denies any abdominal pain, chest pain, shortness of breath.   Assessment & Plan:  Elevated troponin Currently chest pain-free EKG with no acute ST changes Echo showed EF of 70 to 75%, no regional wall motion abnormality Cardiology consulted, likely demand ischemia, no further recommendation, follow-up scheduled for 02/21/2019 Lipid panel unremarkable Continue aspirin, Lopressor  DKA/diabetes mellitus type 2 -resolved as of 01/16/2021 A1c 7.7% Continue SSI, CBG monitoring  Hypokalemia Replace as needed  Sinusitis Continue rocephin   COVID-19 virus infection Patient was considered asymptomatic on admission and remained on room air. Was not started on any treatment Continue supportive care    Antimicrobials: Rocephin 2/20>> current  DVT prophylaxis:  Lovenox   Code Status:   Code Status: Full Code Family Communication: none present  Disposition Plan: Status is: Inpatient  Remains inpatient appropriate because:Ongoing diagnostic testing needed not appropriate for outpatient work up, IV treatments appropriate due to intensity of illness or inability to take PO and Inpatient level of care appropriate due to severity of illness   Dispo: The patient is from: Home              Anticipated d/c is to: Home              Anticipated d/c date is: 1 day              Patient currently is not medically stable to d/c.   Difficult to place patient No    Objective: Blood pressure (!) 148/83, pulse 98, temperature 99.2 F (37.3 C), temperature source Oral, resp. rate 17, height 5\' 9"  (1.753 m), weight 65.9 kg, last menstrual period 09/17/2011, SpO2 99 %.   Examination:  General: NAD   Cardiovascular: S1, S2 present  Respiratory: CTAB  Abdomen: Soft, nontender, nondistended, bowel sounds present  Musculoskeletal: No bilateral pedal edema noted  Skin: Normal  Psychiatry: Normal mood   Consultants:   Cardiology  Procedures:   None  Data Reviewed: I have personally reviewed following labs and imaging studies Results for orders placed or performed during the hospital encounter of 01/15/21 (from the past 24 hour(s))  Glucose, capillary     Status: Abnormal   Collection Time: 01/16/21  8:28 PM  Result Value Ref Range   Glucose-Capillary 121 (H) 70 - 99 mg/dL   Comment 1 Notify RN    Comment 2 Document in Chart  Glucose, capillary     Status: Abnormal   Collection Time: 01/17/21 12:05 AM  Result Value Ref Range   Glucose-Capillary 139 (H) 70 - 99 mg/dL   Comment 1 Notify RN    Comment 2 Document in Chart   Procalcitonin     Status: None   Collection Time: 01/17/21  2:50 AM  Result Value Ref Range   Procalcitonin 0.42 ng/mL  CBC     Status: None   Collection Time: 01/17/21  2:50 AM  Result Value Ref Range   WBC 5.2 4.0  - 10.5 K/uL   RBC 4.06 3.87 - 5.11 MIL/uL   Hemoglobin 12.3 12.0 - 15.0 g/dL   HCT 36.3 36.0 - 46.0 %   MCV 89.4 80.0 - 100.0 fL   MCH 30.3 26.0 - 34.0 pg   MCHC 33.9 30.0 - 36.0 g/dL   RDW 14.3 11.5 - 15.5 %   Platelets 211 150 - 400 K/uL   nRBC 0.0 0.0 - 0.2 %  Basic metabolic panel     Status: Abnormal   Collection Time: 01/17/21  2:50 AM  Result Value Ref Range   Sodium 136 135 - 145 mmol/L   Potassium 2.9 (L) 3.5 - 5.1 mmol/L   Chloride 100 98 - 111 mmol/L   CO2 25 22 - 32 mmol/L   Glucose, Bld 96 70 - 99 mg/dL   BUN 14 6 - 20 mg/dL   Creatinine, Ser 0.53 0.44 - 1.00 mg/dL   Calcium 8.1 (L) 8.9 - 10.3 mg/dL   GFR, Estimated >60 >60 mL/min   Anion gap 11 5 - 15  Magnesium     Status: None   Collection Time: 01/17/21  2:50 AM  Result Value Ref Range   Magnesium 1.7 1.7 - 2.4 mg/dL  Lipid panel     Status: None   Collection Time: 01/17/21  2:50 AM  Result Value Ref Range   Cholesterol 135 0 - 200 mg/dL   Triglycerides 67 <150 mg/dL   HDL 47 >40 mg/dL   Total CHOL/HDL Ratio 2.9 RATIO   VLDL 13 0 - 40 mg/dL   LDL Cholesterol 75 0 - 99 mg/dL  Hepatic function panel     Status: Abnormal   Collection Time: 01/17/21  2:50 AM  Result Value Ref Range   Total Protein 5.3 (L) 6.5 - 8.1 g/dL   Albumin 3.1 (L) 3.5 - 5.0 g/dL   AST 57 (H) 15 - 41 U/L   ALT 49 (H) 0 - 44 U/L   Alkaline Phosphatase 46 38 - 126 U/L   Total Bilirubin 1.0 0.3 - 1.2 mg/dL   Bilirubin, Direct 0.1 0.0 - 0.2 mg/dL   Indirect Bilirubin 0.9 0.3 - 0.9 mg/dL  Glucose, capillary     Status: None   Collection Time: 01/17/21  4:16 AM  Result Value Ref Range   Glucose-Capillary 80 70 - 99 mg/dL  Glucose, capillary     Status: Abnormal   Collection Time: 01/17/21  7:54 AM  Result Value Ref Range   Glucose-Capillary 198 (H) 70 - 99 mg/dL  Glucose, capillary     Status: Abnormal   Collection Time: 01/17/21 11:23 AM  Result Value Ref Range   Glucose-Capillary 151 (H) 70 - 99 mg/dL  Glucose, capillary      Status: Abnormal   Collection Time: 01/17/21 12:07 PM  Result Value Ref Range   Glucose-Capillary 182 (H) 70 - 99 mg/dL  Glucose, capillary     Status: Abnormal  Collection Time: 01/17/21  4:06 PM  Result Value Ref Range   Glucose-Capillary 142 (H) 70 - 99 mg/dL   Comment 1 Notify RN    Comment 2 Document in Chart     Recent Results (from the past 240 hour(s))  SARS CORONAVIRUS 2 (TAT 6-24 HRS) Nasopharyngeal Nasopharyngeal Swab     Status: Abnormal   Collection Time: 01/15/21  9:27 AM   Specimen: Nasopharyngeal Swab  Result Value Ref Range Status   SARS Coronavirus 2 POSITIVE (A) NEGATIVE Final    Comment: (NOTE) SARS-CoV-2 target nucleic acids are DETECTED.  The SARS-CoV-2 RNA is generally detectable in upper and lower respiratory specimens during the acute phase of infection. Positive results are indicative of the presence of SARS-CoV-2 RNA. Clinical correlation with patient history and other diagnostic information is  necessary to determine patient infection status. Positive results do not rule out bacterial infection or co-infection with other viruses.  The expected result is Negative.  Fact Sheet for Patients: SugarRoll.be  Fact Sheet for Healthcare Providers: https://www.woods-mathews.com/  This test is not yet approved or cleared by the [place] FDA and  has been authorized for detection and/or diagnosis of SARS-CoV-2 by FDA under an Emergency Use Authorization (EUA). This EUA will remain  in effect (meaning this test can be used) for the duration of the COVID-19 declaration under Section 564(b)(1) of the Act, 21 U. S.C. section 360bbb-3(b)(1), unless the authorization is terminated or revoked sooner.   Performed at [facility], [place] [address] [place], [place] [address]   MRSA PCR Screening     Status: None   Collection Time: 01/15/21  2:00 PM   Specimen: Nasopharyngeal  Result Value Ref Range Status    MRSA by PCR NEGATIVE NEGATIVE Final    Comment:        The GeneXpert MRSA Assay (FDA approved for NASAL specimens only), is one component of a comprehensive MRSA colonization surveillance program. It is not intended to diagnose MRSA infection nor to guide or monitor treatment for MRSA infections. Performed at [facility], [place] [address], [place], [place] [address]      Radiology Studies: ECHOCARDIOGRAM LIMITED  Result Date: 01/16/2021    ECHOCARDIOGRAM LIMITED REPORT   Patient Name:   SHAYNE DEERMAN Date of Exam: 01/16/2021 Medical Rec #:  [id number]    Height:       69.0 in Accession #:    [id number]   Weight:       145.3 lb Date of Birth:  1961/03/01    BSA:          1.804 m Patient Age:    59 years     BP:           116/66 mmHg Patient Gender: F            HR:           105 bpm. Exam Location:  Inpatient Procedure: Limited Echo, Limited Color Doppler and Color Doppler Indications:    Elevated Troponin  History:        Patient has no prior history of Echocardiogram examinations.                 Covid-19 Positive; Risk Factors:Diabetes.  Sonographer:    Mikki Santee RDCS (AE) Referring Phys: [place]  1. Left ventricular ejection fraction, by estimation, is 70 to 75%.  2. Right ventricular systolic function is normal. The right ventricular size is normal.  3. The mitral valve is normal in structure. No evidence of mitral valve regurgitation.  4. The aortic valve is normal in structure. Aortic valve regurgitation is not visualized. FINDINGS  Left Ventricle: Left ventricular ejection fraction, by estimation, is 70 to 75%. The left ventricular internal cavity size was normal in size. There is no left ventricular hypertrophy. Right Ventricle: The right ventricular size is normal. Right vetricular wall thickness was not assessed. Right ventricular systolic function is normal. Left Atrium: Left atrial size was normal in size. Right Atrium: Right atrial size was  normal in size. Pericardium: There is no evidence of pericardial effusion. Mitral Valve: The mitral valve is normal in structure. Tricuspid Valve: The tricuspid valve is normal in structure. Tricuspid valve regurgitation is not demonstrated. Aortic Valve: The aortic valve is normal in structure. Aortic valve regurgitation is not visualized. Pulmonic Valve: The pulmonic valve was normal in structure. Pulmonic valve regurgitation is not visualized. Aorta: The aortic root is normal in size and structure. IAS/Shunts: The interatrial septum was not assessed. LEFT VENTRICLE PLAX 2D LVIDd:         4.50 cm  Diastology LVIDs:         2.50 cm  LV e' medial:    8.16 cm/s LV PW:         0.80 cm  LV E/e' medial:  8.3 LV IVS:        0.70 cm  LV e' lateral:   7.94 cm/s LVOT diam:     2.10 cm  LV E/e' lateral: 8.5 LVOT Area:     3.46 cm  LEFT ATRIUM         Index LA diam:    2.50 cm 1.39 cm/m   AORTA Ao Root diam: 3.00 cm MITRAL VALVE MV Area (PHT): 3.93 cm    SHUNTS MV Decel Time: 193 msec    Systemic Diam: 2.10 cm MV E velocity: 67.80 cm/s MV A velocity: 76.60 cm/s MV E/A ratio:  0.89 Dorris Carnes MD Electronically signed by Dorris Carnes MD Signature Date/Time: 01/16/2021/5:35:09 PM    Final    DG Chest Port 1 View  Final Result      Scheduled Meds: . amphetamine-dextroamphetamine  20 mg Oral q morning  . aspirin  81 mg Oral Daily  . Chlorhexidine Gluconate Cloth  6 each Topical Daily  . estradiol  0.5 mg Oral Daily  . insulin aspart  0-15 Units Subcutaneous Q4H  . insulin glargine  10 Units Subcutaneous Daily  . levothyroxine  75 mcg Oral Q0600  . metoprolol tartrate  2.5 mg Intravenous Q6H  . vortioxetine HBr  10 mg Oral Daily   PRN Meds: acetaminophen, dextrose, guaiFENesin-dextromethorphan, ondansetron (ZOFRAN) IV Continuous Infusions: . cefTRIAXone (ROCEPHIN)  IV Stopped (01/17/21 1100)     LOS: 2 days    Alma Friendly, MD [facility] 01/17/2021, 7:24 PM

## 2021-01-18 ENCOUNTER — Other Ambulatory Visit: Payer: Self-pay | Admitting: Podiatry

## 2021-01-18 DIAGNOSIS — J329 Chronic sinusitis, unspecified: Secondary | ICD-10-CM

## 2021-01-18 DIAGNOSIS — E1110 Type 2 diabetes mellitus with ketoacidosis without coma: Secondary | ICD-10-CM | POA: Diagnosis not present

## 2021-01-18 DIAGNOSIS — R778 Other specified abnormalities of plasma proteins: Secondary | ICD-10-CM | POA: Diagnosis not present

## 2021-01-18 DIAGNOSIS — M2041 Other hammer toe(s) (acquired), right foot: Secondary | ICD-10-CM

## 2021-01-18 DIAGNOSIS — M2042 Other hammer toe(s) (acquired), left foot: Secondary | ICD-10-CM

## 2021-01-18 DIAGNOSIS — E876 Hypokalemia: Secondary | ICD-10-CM | POA: Diagnosis not present

## 2021-01-18 DIAGNOSIS — U071 COVID-19: Secondary | ICD-10-CM | POA: Diagnosis not present

## 2021-01-18 LAB — BASIC METABOLIC PANEL
Anion gap: 12 (ref 5–15)
BUN: 9 mg/dL (ref 6–20)
CO2: 23 mmol/L (ref 22–32)
Calcium: 8.3 mg/dL — ABNORMAL LOW (ref 8.9–10.3)
Chloride: 102 mmol/L (ref 98–111)
Creatinine, Ser: 0.5 mg/dL (ref 0.44–1.00)
GFR, Estimated: >60 mL/min (ref >60)
Glucose, Bld: 106 mg/dL — ABNORMAL HIGH (ref 70–99)
Potassium: 3 mmol/L — ABNORMAL LOW (ref 3.5–5.1)
Sodium: 137 mmol/L (ref 135–145)

## 2021-01-18 LAB — GLUCOSE, CAPILLARY
Glucose-Capillary: 125 mg/dL — ABNORMAL HIGH (ref 70–99)
Glucose-Capillary: 175 mg/dL — ABNORMAL HIGH (ref 70–99)
Glucose-Capillary: 269 mg/dL — ABNORMAL HIGH (ref 70–99)
Glucose-Capillary: 90 mg/dL (ref 70–99)

## 2021-01-18 MED ORDER — POTASSIUM CHLORIDE CRYS ER 20 MEQ PO TBCR
40.0000 meq | EXTENDED_RELEASE_TABLET | Freq: Once | ORAL | Status: AC
  Administered 2021-01-18: 40 meq via ORAL
  Filled 2021-01-18: qty 2

## 2021-01-18 MED ORDER — AMOXICILLIN-POT CLAVULANATE 875-125 MG PO TABS: 1.0000 | ORAL_TABLET | Freq: Two times a day (BID) | ORAL | 0 refills | Status: AC

## 2021-01-18 MED ORDER — POTASSIUM CHLORIDE 10 MEQ/100ML IV SOLN
10.0000 meq | INTRAVENOUS | Status: AC
  Administered 2021-01-18 (×4): 10 meq via INTRAVENOUS
  Filled 2021-01-18: qty 100

## 2021-01-18 NOTE — Discharge Summary (Signed)
Discharge Summary  Nancy Camacho:[id number] DOB: [birth year]  PCP: Reynold Bowen, MD  Admit date: 01/15/2021 Discharge date: 01/18/2021  Time spent: 40 mins  Recommendations for Outpatient Follow-up:  1. PCP/endocrinology in 1 week 2. Cardiology as scheduled on 02/20/2021  Discharge Diagnoses:  Active Hospital Problems   Diagnosis Date Noted  . COVID-19 virus infection 01/16/2021  . Elevated troponin 01/16/2021  . Sinusitis 01/16/2021  . Hypokalemia 01/16/2021  . Type 2 diabetes mellitus without complication ([place]) 42/68/3419    Resolved Hospital Problems   Diagnosis Date Noted Date Resolved  . AKI (acute kidney injury) ([place]) 01/16/2021 01/16/2021  . DKA (diabetic ketoacidosis) ([place]) 01/15/2021 01/16/2021    Discharge Condition: Stable  Diet recommendation: Mod carb/heart healty  Vitals:   01/18/21 0400 01/18/21 0800  BP:  137/85  Pulse:  99  Resp:  17  Temp: 98.4 F (36.9 C) 99.4 F (37.4 C)  SpO2:  96%    History of present illness:  Nancy Camacho is a 60 yo female with PMH DMII, hypothyroidism, depression/anxiety, chronic back pain who initially presented to the ER with confusion, increased work of breathing, and hyperglycemia.  She was found to be positive for COVID-19 as well as work-up consistent with DKA.  She was transferred to [facility] and initiated on DKA protocol. Pt responded well to fluids/bicarb drip and insulin drip which was able to be transitioned back to subcutaneous insulins. She was considered asymptomatic from a Covid standpoint and remained on room air therefore did not meet criteria for initiating on treatment.  She was transferred to the hospitalist service from [facility] on 01/16/2021. Throughout her work-up, initial troponin was checked and found to be elevated and was further trended which peaked at 196.  There were questionable EKG changes and cardiology was consulted and reported demand ischemia from acute illness. Pt remained chest pain free.     Today, pt denies any new complaints. Discussed extensively with patient about medication compliance, discussed about Covid quarantine, and follow-up appointments, patient verbalized understanding.  Hospital Course:  Active Problems:   Type 2 diabetes mellitus without complication (HCC)   [id number] virus infection   Elevated troponin   Sinusitis   Hypokalemia   Elevated troponin Currently chest pain-free EKG with no acute ST changes Echo showed EF of 70 to 75%, no regional wall motion abnormality Cardiology consulted, likely demand ischemia, no further recommendation, follow-up scheduled for 02/21/2019 Lipid panel unremarkable No indication for aspirin or Lopressor for now  DKA/diabetes mellitus type 2 -resolved as of 01/16/2021 A1c 7.7% Continue home regimen Follow-up with PCP/endocrinologist  Hypokalemia Replaced as needed  Sinusitis S/p rocephin, discharged on p.o. Augmentin to complete 7 days  COVID-19 virus infection Patient was considered asymptomatic on admission and remained on room air. Was not started on any treatment Continue supportive care, discussed about further quarantine         Malnutrition Type:      Malnutrition Characteristics:      Nutrition Interventions:      Estimated body mass index is 21.45 kg/m as calculated from the following:   Height as of this encounter: 5\' 9"  (1.753 m).   Weight as of this encounter: 65.9 kg.    Procedures:  None  Consultations:  PCCM  Cardiology  Discharge Exam: BP 137/85   Pulse 99   Temp 99.4 F (37.4 C) (Oral)   Resp 17   Ht 5\' 9"  (1.753 m)   Wt 65.9 kg   LMP 09/17/2011   SpO2  96%   BMI 21.45 kg/m   General: NAD Cardiovascular: S1, S2 present Respiratory: CTA B  Discharge Instructions You were cared for by a hospitalist during your hospital stay. If you have any questions about your discharge medications or the care you received while you were in the hospital after you  are discharged, you can call the unit and asked to speak with the hospitalist on call if the hospitalist that took care of you is not available. Once you are discharged, your primary care physician will handle any further medical issues. Please note that NO REFILLS for any discharge medications will be authorized once you are discharged, as it is imperative that you return to your primary care physician (or establish a relationship with a primary care physician if you do not have one) for your aftercare needs so that they can reassess your need for medications and monitor your lab values.  Discharge Instructions    Diet - low sodium heart healthy   Complete by: As directed    Increase activity slowly   Complete by: As directed      Allergies as of 01/18/2021      Reactions   Flexeril [cyclobenzaprine] Other (See Comments)   "generic flexeril", r/t bell's palsy      Medication List    TAKE these medications   acetaminophen 500 MG tablet Commonly known as: TYLENOL Take 1,000 mg by mouth every 6 (six) hours as needed for headache (pain).   Adderall XR 20 MG 24 hr capsule Generic drug: amphetamine-dextroamphetamine Take 20 mg by mouth every morning.   amoxicillin-clavulanate 875-125 MG tablet Commonly known as: Augmentin Take 1 tablet by mouth 2 (two) times daily for 4 days.   busPIRone 15 MG tablet Commonly known as: BUSPAR Take 15 mg by mouth every morning.   celecoxib 200 MG capsule Commonly known as: CELEBREX Take 200 mg by mouth every morning.   empagliflozin 25 MG Tabs tablet Commonly known as: JARDIANCE Take 25 mg by mouth daily.   estradiol 0.5 MG tablet Commonly known as: ESTRACE Take 0.5 mg by mouth daily.   estradiol-norethindrone 1-0.5 MG tablet Commonly known as: ACTIVELLA Take 1 tablet by mouth at bedtime.   insulin lispro 100 UNIT/ML KwikPen Commonly known as: HUMALOG Inject 2-3 Units into the skin 3 (three) times daily with meals.   lamoTRIgine 25 MG  tablet Commonly known as: LAMICTAL Take 25 mg by mouth at bedtime.   levothyroxine 75 MCG tablet Commonly known as: SYNTHROID Take 75 mcg by mouth every morning.   MAGNESIUM-ZINC PO Take 1 tablet by mouth at bedtime.   metFORMIN 1000 MG tablet Commonly known as: GLUCOPHAGE Take 1,000 mg by mouth 2 (two) times daily.   ondansetron 8 MG disintegrating tablet Commonly known as: ZOFRAN-ODT Take 4-8 mg by mouth 2 (two) times daily.   Ozempic (1 MG/DOSE) 4 MG/3ML Sopn Generic drug: Semaglutide (1 MG/DOSE) Inject 1 mg into the skin every Monday.   terbinafine 250 MG tablet Commonly known as: LamISIL Take 1 tablet (250 mg total) by mouth daily. What changed: when to take this   [place] FlexTouch 100 UNIT/ML FlexTouch Pen Generic drug: insulin degludec Inject 17 Units into the skin every morning.   Tyrvaya 0.03 MG/ACT Soln Generic drug: Varenicline Tartrate Place 1 spray into both nostrils 2 (two) times daily.   Vitamin D (Ergocalciferol) 1.25 MG (50000 UNIT) Caps capsule Commonly known as: DRISDOL Take 50,000 Units by mouth every Tuesday.   vortioxetine HBr 20 MG Tabs  tablet Commonly known as: TRINTELLIX Take 20 mg by mouth every morning. What changed: Another medication with the same name was removed. Continue taking this medication, and follow the directions you see here.   ZINC PO Take 1 capsule by mouth at bedtime.      Allergies  Allergen Reactions  . Flexeril [Cyclobenzaprine] Other (See Comments)    "generic flexeril", r/t bell's palsy    Follow-up Information    Kroeger, Krista M., PA-C Follow up.   Specialties: Physician Assistant, Cardiology Why: [facility] (Cardiology) - Monday Feb 20, 2021 at 9:15 AM (Arrive by 9:00 AM). Daleen Snook is one of the PAs that works with Dr. Gardiner Rhyme and the cardiology team. This is at our [facility] location. Contact information: [address] [place] [address] [phone]        Reynold Bowen, MD.  Schedule an appointment as soon as possible for a visit in 1 week(s).   Specialty: Endocrinology Contact information: [place] [address] [phone]                The results of significant diagnostics from this hospitalization (including imaging, microbiology, ancillary and laboratory) are listed below for reference.    Significant Diagnostic Studies: DG Chest Port 1 View  Result Date: 01/15/2021 CLINICAL DATA:  Loss of consciousness. EXAM: PORTABLE CHEST 1 VIEW COMPARISON:  None FINDINGS: Normal heart size and mediastinal contours. No acute infiltrate or edema. No effusion or pneumothorax. No acute osseous findings. Artifact from EKG leads. IMPRESSION: Negative chest. Electronically Signed   By: Monte Fantasia M.D.   On: 01/15/2021 07:27   ECHOCARDIOGRAM LIMITED  Result Date: 01/16/2021    ECHOCARDIOGRAM LIMITED REPORT   Patient Name:   BEVAN VU Date of Exam: 01/16/2021 Medical Rec #:  [id number]    Height:       69.0 in Accession #:    [id number]   Weight:       145.3 lb Date of Birth:  03-29-1961    BSA:          1.804 m Patient Age:    45 years     BP:           116/66 mmHg Patient Gender: F            HR:           105 bpm. Exam Location:  Inpatient Procedure: Limited Echo, Limited Color Doppler and Color Doppler Indications:    Elevated Troponin  History:        Patient has no prior history of Echocardiogram examinations.                 Covid-19 Positive; Risk Factors:Diabetes.  Sonographer:    Mikki Santee RDCS (AE) Referring Phys: [place]  1. Left ventricular ejection fraction, by estimation, is 70 to 75%.  2. Right ventricular systolic function is normal. The right ventricular size is normal.  3. The mitral valve is normal in structure. No evidence of mitral valve regurgitation.  4. The aortic valve is normal in structure. Aortic valve regurgitation is not visualized. FINDINGS  Left Ventricle: Left ventricular ejection fraction,  by estimation, is 70 to 75%. The left ventricular internal cavity size was normal in size. There is no left ventricular hypertrophy. Right Ventricle: The right ventricular size is normal. Right vetricular wall thickness was not assessed. Right ventricular systolic function is normal. Left Atrium: Left atrial size was normal in size. Right Atrium:  Right atrial size was normal in size. Pericardium: There is no evidence of pericardial effusion. Mitral Valve: The mitral valve is normal in structure. Tricuspid Valve: The tricuspid valve is normal in structure. Tricuspid valve regurgitation is not demonstrated. Aortic Valve: The aortic valve is normal in structure. Aortic valve regurgitation is not visualized. Pulmonic Valve: The pulmonic valve was normal in structure. Pulmonic valve regurgitation is not visualized. Aorta: The aortic root is normal in size and structure. IAS/Shunts: The interatrial septum was not assessed. LEFT VENTRICLE PLAX 2D LVIDd:         4.50 cm  Diastology LVIDs:         2.50 cm  LV e' medial:    8.16 cm/s LV PW:         0.80 cm  LV E/e' medial:  8.3 LV IVS:        0.70 cm  LV e' lateral:   7.94 cm/s LVOT diam:     2.10 cm  LV E/e' lateral: 8.5 LVOT Area:     3.46 cm  LEFT ATRIUM         Index LA diam:    2.50 cm 1.39 cm/m   AORTA Ao Root diam: 3.00 cm MITRAL VALVE MV Area (PHT): 3.93 cm    SHUNTS MV Decel Time: 193 msec    Systemic Diam: 2.10 cm MV E velocity: 67.80 cm/s MV A velocity: 76.60 cm/s MV E/A ratio:  0.89 Dorris Carnes MD Electronically signed by Dorris Carnes MD Signature Date/Time: 01/16/2021/5:35:09 PM    Final     Microbiology: Recent Results (from the past 240 hour(s))  SARS CORONAVIRUS 2 (TAT 6-24 HRS) Nasopharyngeal Nasopharyngeal Swab     Status: Abnormal   Collection Time: 01/15/21  9:27 AM   Specimen: Nasopharyngeal Swab  Result Value Ref Range Status   SARS Coronavirus 2 POSITIVE (A) NEGATIVE Final    Comment: (NOTE) SARS-CoV-2 target nucleic acids are DETECTED.  The  SARS-CoV-2 RNA is generally detectable in upper and lower respiratory specimens during the acute phase of infection. Positive results are indicative of the presence of SARS-CoV-2 RNA. Clinical correlation with patient history and other diagnostic information is  necessary to determine patient infection status. Positive results do not rule out bacterial infection or co-infection with other viruses.  The expected result is Negative.  Fact Sheet for Patients: SugarRoll.be  Fact Sheet for Healthcare Providers: https://www.woods-mathews.com/  This test is not yet approved or cleared by the [place] FDA and  has been authorized for detection and/or diagnosis of SARS-CoV-2 by FDA under an Emergency Use Authorization (EUA). This EUA will remain  in effect (meaning this test can be used) for the duration of the COVID-19 declaration under Section 564(b)(1) of the Act, 21 U. S.C. section 360bbb-3(b)(1), unless the authorization is terminated or revoked sooner.   Performed at [facility], [place] [address] [place], [place] [address]   MRSA PCR Screening     Status: None   Collection Time: 01/15/21  2:00 PM   Specimen: Nasopharyngeal  Result Value Ref Range Status   MRSA by PCR NEGATIVE NEGATIVE Final    Comment:        The GeneXpert MRSA Assay (FDA approved for NASAL specimens only), is one component of a comprehensive MRSA colonization surveillance program. It is not intended to diagnose MRSA infection nor to guide or monitor treatment for MRSA infections. Performed at [facility], [place] [address], [place],  [address]  Labs: Basic Metabolic Panel: Recent Labs  Lab 01/15/21 1400 01/15/21 1719 01/15/21 2032 01/16/21 0030 01/16/21 0642 01/17/21 0250 01/18/21 0250  NA 143   < > 140 139 140 136 137  K 4.2   < > 4.2 3.1* 3.4* 2.9* 3.0*  CL 109   < > 103 103 101 100 102  CO2 15*   < > 19* 23 24 25  23   GLUCOSE 196*   < > 132* 160* 163* 96 106*  BUN 36*   < > 29* 26* 23* 14 9  CREATININE 1.15*   < > 1.00 0.87 0.71 0.53 0.50  CALCIUM 8.5*   < > 8.4* 8.0* 8.5* 8.1* 8.3*  MG 1.8  --   --   --   --  1.7  --   PHOS 1.4*  --   --   --   --   --   --    < > = values in this interval not displayed.   Liver Function Tests: Recent Labs  Lab 01/15/21 0602 01/17/21 0250  AST 46* 57*  ALT 33 49*  ALKPHOS 100 46  BILITOT 1.0 1.0  PROT 8.1 5.3*  ALBUMIN 4.5 3.1*   Recent Labs  Lab 01/15/21 1719  LIPASE 27   No results for input(s): AMMONIA in the last 168 hours. CBC: Recent Labs  Lab 01/15/21 0602 01/15/21 0621 01/15/21 0912 01/15/21 1400 01/16/21 0642 01/17/21 0250  WBC 19.0*  --   --  15.5* 9.4 5.2  HGB 15.5* 17.3* 15.6* 13.3 12.1 12.3  HCT 52.1* 51.0* 46.0 42.7 36.3 36.3  MCV 102.0*  --   --  96.2 91.0 89.4  PLT 468*  --   --  291 237 211   Cardiac Enzymes: No results for input(s): CKTOTAL, CKMB, CKMBINDEX, TROPONINI in the last 168 hours. BNP: BNP (last 3 results) No results for input(s): BNP in the last 8760 hours.  ProBNP (last 3 results) No results for input(s): PROBNP in the last 8760 hours.  CBG: Recent Labs  Lab 01/17/21 2003 01/18/21 0007 01/18/21 0428 01/18/21 0806 01/18/21 1137  GLUCAP 152* 125* 90 175* 269*       Signed:  Alma Friendly, MD [facility] 01/18/2021, 12:08 PM

## 2021-01-18 NOTE — Progress Notes (Signed)
Pt discharged. PIVs removed, belongings returned, and AVS went over with the patient. She stated she had no further questions. Wheeled down by RN to the front.

## 2021-01-18 NOTE — Progress Notes (Signed)
Inpatient Diabetes Program Recommendations  AACE/ADA: New Consensus Statement on Inpatient Glycemic Control (2015)  Target Ranges:  Prepandial:   less than 140 mg/dL      Peak postprandial:   less than 180 mg/dL (1-2 hours)      Critically ill patients:  140 - 180 mg/dL   Lab Results  Component Value Date   GLUCAP 175 (H) 01/18/2021   HGBA1C 7.7 (H) 01/16/2021    Review of Glycemic Control  Diabetes history: DM2 Outpatient Diabetes medications: Tresiba 17 units QAM, Humalog 2-3 units TID with meals, Jardiance 25 mg QD, metformin 1000 mg BID, Ozempic 1 mg Q weekly Current orders for Inpatient glycemic control: Lantus 10 units QD, Novolog 0-15 units Q4H  HgbA1C - 7.7% Endo Forde Dandy MD  Inpatient Diabetes Program Recommendations:     Spoke with pt about her diabetes and HgbA1c of 7.7%. States her Endo is Dr [place] and she sees him on a regular basis. Monitors her blood sugars at least 4x/day and is careful with diet. We discussed sick day rules with regard to insulin administration. Instructed that if not eating, or nauseated and vomiting, do not take Humalog. Stressed that she still needs to take her [place] Aas, to avoid DKA. Recommended she call Endo and ask if she should decrease amount of Tresiba if nauseated and sick. Pt appreciative of call and will f/u with Endo next week. Started CHO mod diet on 2/21, and expressed her surprise with what all she was getting on the CHO mod diet. (cereal, milk, "a lot of carbs.")  Answered questions and pt to be d/ced today.   Thank you. Lorenda Peck, RD, LDN, CDE Inpatient Diabetes Coordinator [phone]

## 2021-02-17 ENCOUNTER — Ambulatory Visit: Payer: 59 | Admitting: Psychology

## 2021-02-20 ENCOUNTER — Ambulatory Visit: Payer: 59 | Admitting: Medical

## 2021-03-03 ENCOUNTER — Ambulatory Visit (INDEPENDENT_AMBULATORY_CARE_PROVIDER_SITE_OTHER): Payer: 59 | Admitting: Psychology

## 2021-03-03 DIAGNOSIS — F411 Generalized anxiety disorder: Secondary | ICD-10-CM | POA: Diagnosis not present

## 2021-04-06 NOTE — Progress Notes (Addendum)
Date:  04/06/2021   ID:  Nancy Camacho, DOB 1961-09-29, MRN [id number]  PCP:  Nancy Bowen, MD  Cardiologist: Nancy Kras, DO, [facility] (established care 04/06/2021)  REASON FOR CONSULT: Elevated Troponin  REQUESTING PHYSICIAN:  Nancy Bowen, MD [address] [place],  Nancy Camacho [id number]  Chief Complaint  Patient presents with   Elevated Hepatic Enzymes   New Patient (Initial Visit)    HPI  Nancy Camacho is a 60 y.o. female who presents to the office with a chief complaint of "elevated troponin." Patient's past medical history and cardiovascular risk factors include: Insulin-dependent diabetes mellitus LADA, hypothyroidism, anxiety/depression, back pain, COVID-19 infection, hormone replacement therapy, postmenopausal female.  She is referred to the office at the request of Nancy Bowen, MD for evaluation of elevated troponins.  Patient was hospitalized in February 2022 as she presented with symptoms of confusion, increased work of breathing, and hyperglycemia.  She had diabetic ketoacidosis and was transferred to [facility] for further evaluation and management.  During hospitalization she was also noted to have COVID-19 infection but was satting on room air and did not require additional therapy.  And during that hospitalization she was noted to have elevated troponins with a peak I sensitive troponin of 196 without symptoms of angina pectoris.  Echocardiogram noted preserved LVEF without regional wall motion abnormalities.  The presence of troponin was attributed to supply demand ischemia in the setting of DKA and COVID-19 infection.  Patient presents to the office for further evaluation of elevated troponins during her last hospitalization.  She denies any chest pain at rest or with effort related activities.  She does not participate in exercise program or daily routine but is very active at work.  She accounts at least 6000 steps a day.  However, she does notice random episodes of  shortness of breath at rest or with effort related activities along with palpitations.  Hard to does not disturb pain and for secondary to cardiovascular condition or due to the recent COVID-19 infection.  FUNCTIONAL STATUS: No structured exercise program or daily routine.   ALLERGIES: Allergies  Allergen Reactions   Flexeril [Cyclobenzaprine] Other (See Comments)    "generic flexeril", r/t bell's palsy    MEDICATION LIST PRIOR TO VISIT: Current Meds  Medication Sig   acetaminophen (TYLENOL) 500 MG tablet Take 1,000 mg by mouth every 6 (six) hours as needed for headache (pain).   ADDERALL XR 20 MG 24 hr capsule Take 20 mg by mouth every morning.   busPIRone (BUSPAR) 15 MG tablet Take 15 mg by mouth every morning.   celecoxib (CELEBREX) 200 MG capsule Take 200 mg by mouth every morning.   estradiol (ESTRACE) 0.5 MG tablet Take 0.5 mg by mouth daily.   estradiol-norethindrone (ACTIVELLA) 1-0.5 MG tablet Take 1 tablet by mouth at bedtime.   hydrochlorothiazide (HYDRODIURIL) 12.5 MG tablet Take 12.5 mg by mouth every other day.   insulin degludec (TRESIBA FLEXTOUCH) 100 UNIT/ML FlexTouch Pen Inject 17 Units into the skin every morning.   insulin lispro (HUMALOG) 100 UNIT/ML KwikPen Inject 2-3 Units into the skin 3 (three) times daily with meals.   levothyroxine (SYNTHROID) 75 MCG tablet Take 75 mcg by mouth every morning.   MAGNESIUM-ZINC PO Take 1 tablet by mouth at bedtime.   metFORMIN (GLUCOPHAGE) 1000 MG tablet Take 1,000 mg by mouth 2 (two) times daily.   Multiple Vitamins-Minerals (ZINC PO) Take 1 capsule by mouth at bedtime.   ondansetron (ZOFRAN-ODT) 8 MG disintegrating tablet Take 4-8  mg by mouth 2 (two) times daily.   Semaglutide, 1 MG/DOSE, (OZEMPIC, 1 MG/DOSE,) 4 MG/3ML SOPN Inject 1 mg into the skin every Monday.   Vitamin D, Ergocalciferol, (DRISDOL) 1.25 MG (50000 UNIT) CAPS capsule Take 50,000 Units by mouth every Tuesday.   vortioxetine HBr (TRINTELLIX) 20 MG TABS tablet  Take 20 mg by mouth every morning.   [DISCONTINUED] atorvastatin (LIPITOR) 20 MG tablet Take 1 tablet (20 mg total) by mouth at bedtime.   [DISCONTINUED] metoprolol tartrate (LOPRESSOR) 50 MG tablet Take 1 tablet (50 mg total) by mouth 2 (two) times daily. (Patient taking differently: Take 25 mg by mouth 2 (two) times daily.)     PAST MEDICAL HISTORY: Past Medical History:  Diagnosis Date   Anxiety    Chronic back pain    Depression    Diabetes mellitus without complication ([place])    DKA (diabetic ketoacidosis) ([place])    History of Bell's palsy    History of COVID-19    History of seborrhea    ear   Thyroid disease     PAST SURGICAL HISTORY: Past Surgical History:  Procedure Laterality Date   ABDOMINOPLASTY  07/2005   BREAST ENHANCEMENT SURGERY  04/2006   COLONOSCOPY  03/15/2014   POLYPECTOMY     TUBAL LIGATION      FAMILY HISTORY: The patient family history includes Atrial fibrillation in her mother; Diabetes in her father.  SOCIAL HISTORY:  The patient  reports that she has never smoked. She has never used smokeless tobacco. She reports that she does not drink alcohol and does not use drugs.  REVIEW OF SYSTEMS: Review of Systems  Constitutional: Negative for chills and fever.  HENT:  Negative for hoarse voice and nosebleeds.   Eyes:  Negative for discharge, double vision and pain.  Cardiovascular:  Positive for dyspnea on exertion and palpitations. Negative for chest pain, claudication, leg swelling, near-syncope, orthopnea, paroxysmal nocturnal dyspnea and syncope.  Respiratory:  Negative for hemoptysis and shortness of breath.   Musculoskeletal:  Negative for muscle cramps and myalgias.  Gastrointestinal:  Negative for abdominal pain, constipation, diarrhea, hematemesis, hematochezia, melena, nausea and vomiting.  Neurological:  Negative for dizziness and light-headedness.   PHYSICAL EXAM: Vitals with BMI 04/18/2021 04/18/2021 04/18/2021  Height - - -  Weight - - -   BMI - - -  Systolic 500 938 182  Diastolic 73 79 61  Pulse 78 64 -    CONSTITUTIONAL: Well-developed and well-nourished. No acute distress.  SKIN: Skin is warm and dry. No rash noted. No cyanosis. No pallor. No jaundice HEAD: Normocephalic and atraumatic.  EYES: No scleral icterus MOUTH/THROAT: Moist oral membranes.  NECK: No JVD present. No thyromegaly noted. No carotid bruits  LYMPHATIC: No visible cervical adenopathy.  CHEST Normal respiratory effort. No intercostal retractions  LUNGS: Clear to auscultation bilaterally.  No stridor. No wheezes. No rales.  CARDIOVASCULAR: Regular rate and rhythm, positive S1-S2, no murmurs rubs or gallops appreciated ABDOMINAL: No apparent ascites.  EXTREMITIES: No peripheral edema  HEMATOLOGIC: No significant bruising NEUROLOGIC: Oriented to person, place, and time. Nonfocal. Normal muscle tone.  PSYCHIATRIC: Normal mood and affect. Normal behavior. Cooperative  CARDIAC DATABASE: EKG: 04/07/2021: Normal sinus rhythm, 88 bpm, old anteroseptal infarct, without underlying injury pattern.   Echocardiogram: 01/16/2021: Limited echocardiogram LVEF 70-75%, right ventricular systolic size and function normal.  No significant valvular heart disease.  Stress Testing: No results found for this or any previous visit from the past 1095 days.  Heart Catheterization: None  LABORATORY DATA: CBC Latest Ref Rng & Units 01/17/2021 01/16/2021 01/15/2021  WBC 4.0 - 10.5 K/uL 5.2 9.4 15.5(H)  Hemoglobin 12.0 - 15.0 g/dL 12.3 12.1 13.3  Hematocrit 36.0 - 46.0 % 36.3 36.3 42.7  Platelets 150 - 400 K/uL 211 237 291    CMP Latest Ref Rng & Units 01/18/2021 01/17/2021 01/16/2021  Glucose 70 - 99 mg/dL 106(H) 96 163(H)  BUN 6 - 20 mg/dL 9 14 23(H)  Creatinine 0.44 - 1.00 mg/dL 0.50 0.53 0.71  Sodium 135 - 145 mmol/L 137 136 140  Potassium 3.5 - 5.1 mmol/L 3.0(L) 2.9(L) 3.4(L)  Chloride 98 - 111 mmol/L 102 100 101  CO2 22 - 32 mmol/L '23 25 24  ' Calcium 8.9 - 10.3  mg/dL 8.3(L) 8.1(L) 8.5(L)  Total Protein 6.5 - 8.1 g/dL - 5.3(L) -  Total Bilirubin 0.3 - 1.2 mg/dL - 1.0 -  Alkaline Phos 38 - 126 U/L - 46 -  AST 15 - 41 U/L - 57(H) -  ALT 0 - 44 U/L - 49(H) -    Lipid Panel     Component Value Date/Time   CHOL 135 01/17/2021 0250   TRIG 67 01/17/2021 0250   HDL 47 01/17/2021 0250   CHOLHDL 2.9 01/17/2021 0250   VLDL 13 01/17/2021 0250   LDLCALC 75 01/17/2021 0250    No components found for: NTPROBNP No results for input(s): PROBNP in the last 8760 hours. Recent Labs    01/16/21 1025  TSH 1.040    BMP Recent Labs    01/16/21 0642 01/17/21 0250 01/18/21 0250  NA 140 136 137  K 3.4* 2.9* 3.0*  CL 101 100 102  CO2 '24 25 23  ' GLUCOSE 163* 96 106*  BUN 23* 14 9  CREATININE 0.71 0.53 0.50  CALCIUM 8.5* 8.1* 8.3*  GFRNONAA >60 >60 >60    HEMOGLOBIN A1C Lab Results  Component Value Date   HGBA1C 7.7 (H) 01/16/2021   MPG 174.29 01/16/2021   External Labs:  Date Collected: 03/23/2021 , information obtained by [facility] Potassium: 4.7 Creatinine 0.8 mg/dL. eGFR: 73.9 mL/min per 1.73 m AST: 20 , ALT: 25 , alkaline phosphatase: 76  TSH: 1.06  08/31/2020: Hemoglobin: 14.4 g/dL and hematocrit: 44.6 %  01/12/2021: Hemoglobin A1c: 7.0  IMPRESSION:    ICD-10-CM   1. Elevated troponin  R77.8 EKG 12-Lead    CT CORONARY MORPH W/CTA COR W/SCORE W/CA W/CM &/OR WO/CM    PCV ECHOCARDIOGRAM COMPLETE    DISCONTINUED: metoprolol tartrate (LOPRESSOR) 50 MG tablet    2. Abnormal EKG  R94.31 CT CORONARY MORPH W/CTA COR W/SCORE W/CA W/CM &/OR WO/CM    PCV ECHOCARDIOGRAM COMPLETE    3. Type 2 diabetes mellitus with hyperglycemia, with long-term current use of insulin (HCC)  S96.28 Basic metabolic panel   Z66.2 DISCONTINUED: atorvastatin (LIPITOR) 20 MG tablet    4. History of COVID-19  Z86.16     5. Hypothyroidism, unspecified type  E03.9     6. Hormone replacement therapy  Z79.890     7. Dyspnea on exertion   R06.00 CT CORONARY MORPH W/CTA COR W/SCORE W/CA W/CM &/OR WO/CM    PCV ECHOCARDIOGRAM COMPLETE    DISCONTINUED: metoprolol tartrate (LOPRESSOR) 50 MG tablet       RECOMMENDATIONS: Monice ADALYNN CORNE is a 60 y.o. female whose past medical history and cardiac risk factors include:  Insulin-dependent diabetes mellitus LADA, hypothyroidism, anxiety/depression, back pain, COVID-19 infection, hormone replacement therapy, postmenopausal female.  During recent hospitalization she  was noted to have an elevated high sensitive troponin which was attributed to supply demand ischemia in the setting of DKA and COVID-19 infection.  With regards to ischemic symptoms patient does not have any chest pain at rest or with effort related activities but does have a dyspnea on exertion.  The dyspnea may be secondary to her history of COVID-19 infection but ischemic evaluation is warranted given her multiple cardiovascular risk factors including insulin-dependent diabetes.  Check BMP.  Start Lopressor 50 mg p.o. twice daily for better heart rate control prior to coronary CTA.  Echocardiogram will be ordered to evaluate for structural heart disease and left ventricular systolic function.  Patient's blood pressure is currently not at goal.  I suspect that she may have underlying benign essential hypertension.  I have asked her to keep a log of her blood pressures and to review it with her primary care provider or myself at the next office visit.  She is currently on hydrochlorothiazide which she takes on as needed basis for lower extremity swelling.  Given her history of insulin-dependent diabetes, and her estimated 10-year risk of ASCVD recommended that she be on statin therapy.  Will initiate Lipitor 20 mg p.o. nightly to target an LDL of less than 70 mg/dL.  I will see the patient back in about 4 to 6 weeks after the completion of the echo and coronary CTA to review the test results and provide additional recommendations if  needed.  Patient is agreeable with the plan of care and is thankful for the time spent during today's office visit to review her medical history, risk factors, recent hospitalization including records, EKGs, echocardiogram results.   FINAL MEDICATION LIST END OF ENCOUNTER: Meds ordered this encounter  Medications   DISCONTD: metoprolol tartrate (LOPRESSOR) 50 MG tablet    Sig: Take 1 tablet (50 mg total) by mouth 2 (two) times daily.    Dispense:  60 tablet    Refill:  0   DISCONTD: atorvastatin (LIPITOR) 20 MG tablet    Sig: Take 1 tablet (20 mg total) by mouth at bedtime.    Dispense:  90 tablet    Refill:  0    Medications Discontinued During This Encounter  Medication Reason   empagliflozin (JARDIANCE) 25 MG TABS tablet Error   lamoTRIgine (LAMICTAL) 25 MG tablet Error   terbinafine (LAMISIL) 250 MG tablet Error   Varenicline Tartrate (TYRVAYA) 0.03 MG/ACT SOLN Error     Current Outpatient Medications:    acetaminophen (TYLENOL) 500 MG tablet, Take 1,000 mg by mouth every 6 (six) hours as needed for headache (pain)., Disp: , Rfl:    ADDERALL XR 20 MG 24 hr capsule, Take 20 mg by mouth every morning., Disp: , Rfl:    busPIRone (BUSPAR) 15 MG tablet, Take 15 mg by mouth every morning., Disp: , Rfl:    celecoxib (CELEBREX) 200 MG capsule, Take 200 mg by mouth every morning., Disp: , Rfl:    estradiol (ESTRACE) 0.5 MG tablet, Take 0.5 mg by mouth daily., Disp: , Rfl:    estradiol-norethindrone (ACTIVELLA) 1-0.5 MG tablet, Take 1 tablet by mouth at bedtime., Disp: , Rfl:    hydrochlorothiazide (HYDRODIURIL) 12.5 MG tablet, Take 12.5 mg by mouth every other day., Disp: , Rfl:    insulin degludec (TRESIBA FLEXTOUCH) 100 UNIT/ML FlexTouch Pen, Inject 17 Units into the skin every morning., Disp: , Rfl:    insulin lispro (HUMALOG) 100 UNIT/ML KwikPen, Inject 2-3 Units into the skin 3 (three) times daily  with meals., Disp: , Rfl:    levothyroxine (SYNTHROID) 75 MCG tablet, Take 75 mcg by  mouth every morning., Disp: , Rfl:    MAGNESIUM-ZINC PO, Take 1 tablet by mouth at bedtime., Disp: , Rfl:    metFORMIN (GLUCOPHAGE) 1000 MG tablet, Take 1,000 mg by mouth 2 (two) times daily., Disp: , Rfl:    Multiple Vitamins-Minerals (ZINC PO), Take 1 capsule by mouth at bedtime., Disp: , Rfl:    ondansetron (ZOFRAN-ODT) 8 MG disintegrating tablet, Take 4-8 mg by mouth 2 (two) times daily., Disp: , Rfl:    Semaglutide, 1 MG/DOSE, (OZEMPIC, 1 MG/DOSE,) 4 MG/3ML SOPN, Inject 1 mg into the skin every Monday., Disp: , Rfl:    Vitamin D, Ergocalciferol, (DRISDOL) 1.25 MG (50000 UNIT) CAPS capsule, Take 50,000 Units by mouth every Tuesday., Disp: , Rfl:    vortioxetine HBr (TRINTELLIX) 20 MG TABS tablet, Take 20 mg by mouth every morning., Disp: , Rfl:    atorvastatin (LIPITOR) 20 MG tablet, TAKE ONE TABLET BY MOUTH AT BEDTIME, Disp: 90 tablet, Rfl: 0   metoprolol tartrate (LOPRESSOR) 50 MG tablet, TAKE ONE TABLET BY MOUTH TWICE DAILY, Disp: 60 tablet, Rfl: 0  Orders Placed This Encounter  Procedures   CT CORONARY MORPH W/CTA COR W/SCORE W/CA W/CM &/OR WO/CM   Basic metabolic panel   EKG 01-VCBS   PCV ECHOCARDIOGRAM COMPLETE    There are no Patient Instructions on file for this visit.   --Continue cardiac medications as reconciled in final medication list. --Return in about 4 weeks (around 05/05/2021) for Follow up elevated troponin and , Review test results. Or sooner if needed. --Continue follow-up with your primary care physician regarding the management of your other chronic comorbid conditions.  Patient's questions and concerns were addressed to her satisfaction. She voices understanding of the instructions provided during this encounter.   This note was created using a voice recognition software as a result there may be grammatical errors inadvertently enclosed that do not reflect the nature of this encounter. Every attempt is made to correct such errors.  Nancy Camacho, [place],  [facility]  Pager: [phone] Office: [phone]

## 2021-04-07 ENCOUNTER — Other Ambulatory Visit: Payer: Self-pay

## 2021-04-07 ENCOUNTER — Encounter: Payer: Self-pay | Admitting: Cardiology

## 2021-04-07 ENCOUNTER — Ambulatory Visit: Payer: 59 | Admitting: Cardiology

## 2021-04-07 VITALS — BP 135/83 | HR 100 | Resp 16 | Ht 69.0 in | Wt 163.0 lb

## 2021-04-07 DIAGNOSIS — E039 Hypothyroidism, unspecified: Secondary | ICD-10-CM

## 2021-04-07 DIAGNOSIS — R0609 Other forms of dyspnea: Secondary | ICD-10-CM

## 2021-04-07 DIAGNOSIS — Z8616 Personal history of COVID-19: Secondary | ICD-10-CM

## 2021-04-07 DIAGNOSIS — R778 Other specified abnormalities of plasma proteins: Secondary | ICD-10-CM

## 2021-04-07 DIAGNOSIS — R9431 Abnormal electrocardiogram [ECG] [EKG]: Secondary | ICD-10-CM

## 2021-04-07 DIAGNOSIS — E1165 Type 2 diabetes mellitus with hyperglycemia: Secondary | ICD-10-CM

## 2021-04-07 DIAGNOSIS — Z79890 Hormone replacement therapy (postmenopausal): Secondary | ICD-10-CM

## 2021-04-07 MED ORDER — ATORVASTATIN CALCIUM 20 MG PO TABS: 20.0000 mg | ORAL_TABLET | Freq: Every day | ORAL | 0 refills | Status: DC

## 2021-04-07 MED ORDER — METOPROLOL TARTRATE 50 MG PO TABS: 50.0000 mg | ORAL_TABLET | Freq: Two times a day (BID) | ORAL | 0 refills | Status: DC

## 2021-04-14 ENCOUNTER — Telehealth (HOSPITAL_COMMUNITY): Payer: Self-pay | Admitting: Emergency Medicine

## 2021-04-14 NOTE — Telephone Encounter (Signed)
Reaching out to patient to offer assistance regarding upcoming cardiac imaging study; pt verbalizes understanding of appt date/time, parking situation and where to check in, pre-test NPO status and medications ordered, and verified current allergies; name and call back number provided for further questions should they arise Nancy Bond RN Navigator Cardiac [facility] [phone] office [phone] cell  Pt given new rx 50mg  metoprolol tart BID to take leading up to CCTA. Pt reports dizziness and has cut dose to 25mg  BID. Pt states no dizziness.   She will avoid taking adderall and HCTZ x 2 days prior to scan.   Nancy Camacho

## 2021-04-18 ENCOUNTER — Other Ambulatory Visit: Payer: Self-pay

## 2021-04-18 ENCOUNTER — Telehealth: Payer: Self-pay

## 2021-04-18 ENCOUNTER — Ambulatory Visit (HOSPITAL_COMMUNITY)
Admission: RE | Admit: 2021-04-18 | Discharge: 2021-04-18 | Disposition: A | Discharge status: Home / Self Care | Payer: 59 | Source: Ambulatory Visit | Attending: Endocrinology | Admitting: Endocrinology

## 2021-04-18 DIAGNOSIS — R0600 Dyspnea, unspecified: Secondary | ICD-10-CM | POA: Insufficient documentation

## 2021-04-18 DIAGNOSIS — N6320 Unspecified lump in the left breast, unspecified quadrant: Secondary | ICD-10-CM | POA: Diagnosis not present

## 2021-04-18 DIAGNOSIS — R778 Other specified abnormalities of plasma proteins: Secondary | ICD-10-CM | POA: Diagnosis present

## 2021-04-18 DIAGNOSIS — R0609 Other forms of dyspnea: Secondary | ICD-10-CM

## 2021-04-18 MED ORDER — NITROGLYCERIN 0.4 MG SL SUBL
0.8000 mg | SUBLINGUAL_TABLET | Freq: Once | SUBLINGUAL | Status: AC
  Administered 2021-04-18: 0.8 mg via SUBLINGUAL

## 2021-04-18 MED ORDER — IOHEXOL 350 MG/ML SOLN
95.0000 mL | Freq: Once | INTRAVENOUS | Status: AC | PRN
  Administered 2021-04-18: 95 mL via INTRAVENOUS

## 2021-04-18 MED ORDER — SODIUM CHLORIDE 0.9 % IV BOLUS
500.0000 mL | Freq: Once | INTRAVENOUS | Status: AC
  Administered 2021-04-18: 500 mL via INTRAVENOUS

## 2021-04-18 MED ORDER — NITROGLYCERIN 0.4 MG SL SUBL
SUBLINGUAL_TABLET | SUBLINGUAL | Status: AC
  Filled 2021-04-18: qty 2

## 2021-04-18 NOTE — Telephone Encounter (Signed)
Please inform the patient of the finding and have her follow up with either her PCP or OB/GYN.   Will need followup mammogram as per report.   Send a copy of the CT for reference.

## 2021-04-18 NOTE — Telephone Encounter (Signed)
Nancy Camacho -Radiologist tech @ [facility]:  Hulen Skains about concerns impression #2.

## 2021-04-18 NOTE — Progress Notes (Signed)
CT scan completed. Tolerated well. D/C home ambulatory, awake and alert. In no distress. 

## 2021-04-19 DIAGNOSIS — R0609 Other forms of dyspnea: Secondary | ICD-10-CM | POA: Insufficient documentation

## 2021-04-19 DIAGNOSIS — R0600 Dyspnea, unspecified: Secondary | ICD-10-CM | POA: Insufficient documentation

## 2021-04-25 NOTE — Telephone Encounter (Signed)
Tried calling patient, VM is that of a plastic surgery office. I did not leave a message, but I did call home number and left a message.

## 2021-04-27 ENCOUNTER — Other Ambulatory Visit: Payer: Self-pay

## 2021-04-27 ENCOUNTER — Ambulatory Visit: Payer: 59

## 2021-04-27 DIAGNOSIS — R778 Other specified abnormalities of plasma proteins: Secondary | ICD-10-CM

## 2021-04-27 DIAGNOSIS — R0609 Other forms of dyspnea: Secondary | ICD-10-CM

## 2021-04-27 DIAGNOSIS — R0600 Dyspnea, unspecified: Secondary | ICD-10-CM

## 2021-04-28 NOTE — Progress Notes (Signed)
Spoke to patient about her results patient voiced understanding she is going to follow up with the breast center she goes to. Patient said metoprolol is making her feel very tried "can't keep her head up" patient was recommended to cut pill in half bid MAR has been updated

## 2021-05-04 ENCOUNTER — Other Ambulatory Visit: Payer: Self-pay | Admitting: Cardiology

## 2021-05-04 DIAGNOSIS — E1165 Type 2 diabetes mellitus with hyperglycemia: Secondary | ICD-10-CM

## 2021-05-04 DIAGNOSIS — R0609 Other forms of dyspnea: Secondary | ICD-10-CM

## 2021-05-04 DIAGNOSIS — R778 Other specified abnormalities of plasma proteins: Secondary | ICD-10-CM

## 2021-06-21 ENCOUNTER — Other Ambulatory Visit: Payer: Self-pay

## 2021-06-21 ENCOUNTER — Encounter: Payer: Self-pay | Admitting: Cardiology

## 2021-06-21 ENCOUNTER — Ambulatory Visit: Payer: 59 | Admitting: Cardiology

## 2021-06-21 VITALS — BP 108/61 | HR 78 | Temp 97.8°F | Resp 16 | Ht 69.0 in | Wt 161.4 lb

## 2021-06-21 DIAGNOSIS — R0609 Other forms of dyspnea: Secondary | ICD-10-CM

## 2021-06-21 DIAGNOSIS — Z8616 Personal history of COVID-19: Secondary | ICD-10-CM

## 2021-06-21 DIAGNOSIS — Z79890 Hormone replacement therapy (postmenopausal): Secondary | ICD-10-CM

## 2021-06-21 DIAGNOSIS — R0600 Dyspnea, unspecified: Secondary | ICD-10-CM

## 2021-06-21 DIAGNOSIS — R9431 Abnormal electrocardiogram [ECG] [EKG]: Secondary | ICD-10-CM

## 2021-06-21 DIAGNOSIS — E1165 Type 2 diabetes mellitus with hyperglycemia: Secondary | ICD-10-CM

## 2021-06-21 DIAGNOSIS — E039 Hypothyroidism, unspecified: Secondary | ICD-10-CM

## 2021-06-21 DIAGNOSIS — Z794 Long term (current) use of insulin: Secondary | ICD-10-CM

## 2021-06-21 DIAGNOSIS — R778 Other specified abnormalities of plasma proteins: Secondary | ICD-10-CM

## 2021-06-21 NOTE — Progress Notes (Signed)
ID:  Nancy Camacho, DOB Dec 01, 1960, MRN [id number]  PCP:  Reynold Bowen, MD  Cardiologist: Rex Kras, DO, [facility] (established care 04/06/2021)  Date: 06/21/21 Last Office Visit: 04/07/2021   Chief Complaint  Patient presents with   Results   Follow-up    HPI  Nancy Camacho is a 60 y.o. female who presents to the office with a chief complaint of " review test results." Patient's past medical history and cardiovascular risk factors include: Insulin-dependent diabetes mellitus LADA, hypothyroidism, anxiety/depression, back pain, COVID-19 infection, hormone replacement therapy, postmenopausal female.  She is referred to the office at the request of Reynold Bowen, MD for evaluation of elevated troponins.  Patient was hospitalized in February 2022 for diabetic ketoacidosis and COVID-19 infection.  During her hospitalization she was noted to have an elevated troponin with a high sensitive troponin of 196 without any symptoms of angina pectoris.  Initially thought to be secondary to supply demand ischemia and then transferred to cardiology for further evaluation and management.  At the last office visit patient did not have any ischemic symptoms or heart failure on presentation.  However, given her multiple cardiovascular risk factors including insulin-dependent diabetes the shared decision was to proceed with coronary CTA and an echocardiogram for further evaluation and management.  Since last office visit patient states that she is doing well from a cardiovascular standpoint and remains asymptomatic with regards to anginal discomfort or heart failure symptoms.  Patient was also started on Lipitor 20 mg p.o. nightly given her history of insulin-dependent diabetes mellitus.  She is tolerating the medication well without any side effects or intolerances.  She has been experiencing tiredness, fatigue and has decreased the dose of metoprolol to 12.5 mg p.o. daily  FUNCTIONAL STATUS: No structured  exercise program or daily routine.   ALLERGIES: Allergies  Allergen Reactions   Flexeril [Cyclobenzaprine] Other (See Comments)    "generic flexeril", r/t bell's palsy    MEDICATION LIST PRIOR TO VISIT: Current Meds  Medication Sig   acetaminophen (TYLENOL) 500 MG tablet Take 1,000 mg by mouth every 6 (six) hours as needed for headache (pain).   ADDERALL XR 20 MG 24 hr capsule Take 20 mg by mouth every morning.   atorvastatin (LIPITOR) 20 MG tablet TAKE ONE TABLET BY MOUTH AT BEDTIME   busPIRone (BUSPAR) 15 MG tablet Take 15 mg by mouth every morning.   celecoxib (CELEBREX) 200 MG capsule Take 200 mg by mouth every morning.   Coenzyme Q10 (CO Q 10 PO) Take 1 tablet by mouth daily at 12 noon.   estradiol (ESTRACE) 0.5 MG tablet Take 0.5 mg by mouth daily.   insulin lispro (HUMALOG) 100 UNIT/ML KwikPen Inject 2-3 Units into the skin 3 (three) times daily with meals.   levothyroxine (SYNTHROID) 75 MCG tablet Take 75 mcg by mouth every morning.   MAGNESIUM-ZINC PO Take 1 tablet by mouth at bedtime.   Multiple Vitamins-Minerals (ZINC PO) Take 1 capsule by mouth at bedtime.   ondansetron (ZOFRAN-ODT) 8 MG disintegrating tablet Take 4-8 mg by mouth 2 (two) times daily.   Semaglutide, 1 MG/DOSE, (OZEMPIC, 1 MG/DOSE,) 4 MG/3ML SOPN Inject 1 mg into the skin every Monday.   Vitamin D, Ergocalciferol, (DRISDOL) 1.25 MG (50000 UNIT) CAPS capsule Take 50,000 Units by mouth every Tuesday.   vortioxetine HBr (TRINTELLIX) 20 MG TABS tablet Take 20 mg by mouth every morning.   [DISCONTINUED] metoprolol tartrate (LOPRESSOR) 50 MG tablet TAKE ONE TABLET BY MOUTH TWICE DAILY (Patient taking  differently: Take by mouth 2 (two) times daily. 1/4 of a tablet)     PAST MEDICAL HISTORY: Past Medical History:  Diagnosis Date   Anxiety    Chronic back pain    Depression    Diabetes mellitus without complication ([place])    DKA (diabetic ketoacidosis) ([place])    History of Bell's palsy    History of COVID-19     History of seborrhea    ear   Thyroid disease     PAST SURGICAL HISTORY: Past Surgical History:  Procedure Laterality Date   ABDOMINOPLASTY  07/2005   BREAST ENHANCEMENT SURGERY  04/2006   COLONOSCOPY  03/15/2014   POLYPECTOMY     TUBAL LIGATION      FAMILY HISTORY: The patient family history includes Atrial fibrillation in her mother; Diabetes in her father.  SOCIAL HISTORY:  The patient  reports that she has never smoked. She has never used smokeless tobacco. She reports that she does not drink alcohol and does not use drugs.  REVIEW OF SYSTEMS: Review of Systems  Constitutional: Negative for chills and fever.  HENT:  Negative for hoarse voice and nosebleeds.   Eyes:  Negative for discharge, double vision and pain.  Cardiovascular:  Negative for chest pain, claudication, dyspnea on exertion, leg swelling, near-syncope, orthopnea, palpitations, paroxysmal nocturnal dyspnea and syncope.  Respiratory:  Negative for hemoptysis and shortness of breath.   Musculoskeletal:  Negative for muscle cramps and myalgias.  Gastrointestinal:  Negative for abdominal pain, constipation, diarrhea, hematemesis, hematochezia, melena, nausea and vomiting.  Neurological:  Negative for dizziness and light-headedness.   PHYSICAL EXAM: Vitals with BMI 06/21/2021 04/18/2021 04/18/2021  Height '5\' 9"'  - -  Weight 161 lbs 6 oz - -  BMI 16.10 - -  Systolic 960 454 098  Diastolic 61 73 79  Pulse 78 78 64    CONSTITUTIONAL: Well-developed and well-nourished. No acute distress.  SKIN: Skin is warm and dry. No rash noted. No cyanosis. No pallor. No jaundice HEAD: Normocephalic and atraumatic.  EYES: No scleral icterus MOUTH/THROAT: Moist oral membranes.  NECK: No JVD present. No thyromegaly noted. No carotid bruits  LYMPHATIC: No visible cervical adenopathy.  CHEST Normal respiratory effort. No intercostal retractions  LUNGS: Clear to auscultation bilaterally.  No stridor. No wheezes. No rales.   CARDIOVASCULAR: Regular rate and rhythm, positive S1-S2, no murmurs rubs or gallops appreciated ABDOMINAL: No apparent ascites.  EXTREMITIES: No peripheral edema  HEMATOLOGIC: No significant bruising NEUROLOGIC: Oriented to person, place, and time. Nonfocal. Normal muscle tone.  PSYCHIATRIC: Normal mood and affect. Normal behavior. Cooperative  CARDIAC DATABASE: EKG: 04/07/2021: Normal sinus rhythm, 88 bpm, old anteroseptal infarct, without underlying injury pattern.   Echocardiogram: 01/16/2021: Limited echocardiogram LVEF 70-75%, right ventricular systolic size and function normal.  No significant valvular heart disease.  04/27/2021: Normal LV systolic function with visual EF 55-60%. Left ventricle cavity is normal in size. Normal left ventricular wall thickness. Normal global wall motion. Normal diastolic filling pattern, normal LAP.  No significant valvular heart disease. Compared to prior study 01/16/2021 no significant change.   Stress Testing: No results found for this or any previous visit from the past 1095 days.  CCTA 04/18/2021: 1. Total coronary calcium score of 0. 2. Normal coronary origin with right dominance. 3. CAD-RADS = 0. Non cardiac findings: Left breast nodule of 1.1 cm, partially calcified. Possibly a fibroadenoma, but nonspecific. Correlate with diagnostic mammogram and ultrasound.  Heart Catheterization: None  LABORATORY DATA: CBC Latest Ref Rng & Units  01/17/2021 01/16/2021 01/15/2021  WBC 4.0 - 10.5 K/uL 5.2 9.4 15.5(H)  Hemoglobin 12.0 - 15.0 g/dL 12.3 12.1 13.3  Hematocrit 36.0 - 46.0 % 36.3 36.3 42.7  Platelets 150 - 400 K/uL 211 237 291    CMP Latest Ref Rng & Units 01/18/2021 01/17/2021 01/16/2021  Glucose 70 - 99 mg/dL 106(H) 96 163(H)  BUN 6 - 20 mg/dL 9 14 23(H)  Creatinine 0.44 - 1.00 mg/dL 0.50 0.53 0.71  Sodium 135 - 145 mmol/L 137 136 140  Potassium 3.5 - 5.1 mmol/L 3.0(L) 2.9(L) 3.4(L)  Chloride 98 - 111 mmol/L 102 100 101  CO2 22 - 32  mmol/L '23 25 24  ' Calcium 8.9 - 10.3 mg/dL 8.3(L) 8.1(L) 8.5(L)  Total Protein 6.5 - 8.1 g/dL - 5.3(L) -  Total Bilirubin 0.3 - 1.2 mg/dL - 1.0 -  Alkaline Phos 38 - 126 U/L - 46 -  AST 15 - 41 U/L - 57(H) -  ALT 0 - 44 U/L - 49(H) -    Lipid Panel     Component Value Date/Time   CHOL 135 01/17/2021 0250   TRIG 67 01/17/2021 0250   HDL 47 01/17/2021 0250   CHOLHDL 2.9 01/17/2021 0250   VLDL 13 01/17/2021 0250   LDLCALC 75 01/17/2021 0250    No components found for: NTPROBNP No results for input(s): PROBNP in the last 8760 hours. Recent Labs    01/16/21 1025  TSH 1.040    BMP Recent Labs    01/16/21 0642 01/17/21 0250 01/18/21 0250  NA 140 136 137  K 3.4* 2.9* 3.0*  CL 101 100 102  CO2 '24 25 23  ' GLUCOSE 163* 96 106*  BUN 23* 14 9  CREATININE 0.71 0.53 0.50  CALCIUM 8.5* 8.1* 8.3*  GFRNONAA >60 >60 >60    HEMOGLOBIN A1C Lab Results  Component Value Date   HGBA1C 7.7 (H) 01/16/2021   MPG 174.29 01/16/2021   External Labs:  Date Collected: 03/23/2021 , information obtained by [facility] Potassium: 4.7 Creatinine 0.8 mg/dL. eGFR: 73.9 mL/min per 1.73 m AST: 20 , ALT: 25 , alkaline phosphatase: 76  TSH: 1.06  08/31/2020: Hemoglobin: 14.4 g/dL and hematocrit: 44.6 %  01/12/2021: Hemoglobin A1c: 7.0  IMPRESSION:    ICD-10-CM   1. Abnormal EKG  R94.31     2. Elevated troponin  R77.8     3. Type 2 diabetes mellitus with hyperglycemia, with long-term current use of insulin (HCC)  E11.65 Lipid Panel With LDL/HDL Ratio   Z79.4 LDL cholesterol, direct    CMP14+EGFR    4. History of COVID-19  Z86.16     5. Hypothyroidism, unspecified type  E03.9     6. Hormone replacement therapy  Z79.890     7. Dyspnea on exertion  R06.00        RECOMMENDATIONS: Nancy Camacho is a 60 y.o. female whose past medical history and cardiac risk factors include:  Insulin-dependent diabetes mellitus LADA, hypothyroidism, anxiety/depression, back pain,  COVID-19 infection, hormone replacement therapy, postmenopausal female.  Patient initially presented for evaluation of elevated troponin noted in the setting of diabetic ketoacidosis and COVID-19 infection.  Though asymptomatic given her cardiovascular risk factors including insulin-dependent diabetes and ischemic evaluation was recommended.  She underwent a coronary CTA which notes a coronary calcium score of 0 and no significant epicardial coronary artery disease.  These findings were discussed with her in great detail at today's office visit.  Echocardiogram notes preserved LVEF without any significant valvular heart  disease.  Given her insulin-dependent diabetes recommended her to be on statin therapy at the last office visit.  She is tolerating Lipitor 20 mg p.o. nightly.  We will check a fasting lipid profile and a CMP.  Patient's blood  Patient at today's office visit is well controlled.  She is only taking 12.5 of metoprolol tartrate twice a day but has been experiencing feeling tired, fatigue, decreased energy.  The shared decision was to hold off metoprolol for now and to reevaluate.  Her coronary CTA also noted noncardiac findings of a left-sided breast nodule.  Patient states that she had a mammogram earlier this year and followed up with the breast center and was recommended not to have any additional testing.  Will defer additional management to PCP at this time.  From a cardiovascular standpoint I think she is doing relatively well.  No additional testing needed at this time.  I would like to see her on an annual basis ideally couple weeks after her annual physical so that we have new labs to review/reference.  She is more than welcome to see [place] sooner if change in clinical status.  Thank you for allowing [place] to participate in the care of this very young/pleasant female please do not hesitate if any questions or concerns arise to reach out.  FINAL MEDICATION LIST END OF ENCOUNTER: No  orders of the defined types were placed in this encounter.   Medications Discontinued During This Encounter  Medication Reason   estradiol-norethindrone (ACTIVELLA) 1-0.5 MG tablet Error   hydrochlorothiazide (HYDRODIURIL) 12.5 MG tablet Error   insulin degludec (TRESIBA FLEXTOUCH) 100 UNIT/ML FlexTouch Pen Error   metFORMIN (GLUCOPHAGE) 1000 MG tablet Error   metoprolol tartrate (LOPRESSOR) 50 MG tablet Dose change     Current Outpatient Medications:    acetaminophen (TYLENOL) 500 MG tablet, Take 1,000 mg by mouth every 6 (six) hours as needed for headache (pain)., Disp: , Rfl:    ADDERALL XR 20 MG 24 hr capsule, Take 20 mg by mouth every morning., Disp: , Rfl:    atorvastatin (LIPITOR) 20 MG tablet, TAKE ONE TABLET BY MOUTH AT BEDTIME, Disp: 90 tablet, Rfl: 0   busPIRone (BUSPAR) 15 MG tablet, Take 15 mg by mouth every morning., Disp: , Rfl:    celecoxib (CELEBREX) 200 MG capsule, Take 200 mg by mouth every morning., Disp: , Rfl:    Coenzyme Q10 (CO Q 10 PO), Take 1 tablet by mouth daily at 12 noon., Disp: , Rfl:    estradiol (ESTRACE) 0.5 MG tablet, Take 0.5 mg by mouth daily., Disp: , Rfl:    insulin lispro (HUMALOG) 100 UNIT/ML KwikPen, Inject 2-3 Units into the skin 3 (three) times daily with meals., Disp: , Rfl:    levothyroxine (SYNTHROID) 75 MCG tablet, Take 75 mcg by mouth every morning., Disp: , Rfl:    MAGNESIUM-ZINC PO, Take 1 tablet by mouth at bedtime., Disp: , Rfl:    Multiple Vitamins-Minerals (ZINC PO), Take 1 capsule by mouth at bedtime., Disp: , Rfl:    ondansetron (ZOFRAN-ODT) 8 MG disintegrating tablet, Take 4-8 mg by mouth 2 (two) times daily., Disp: , Rfl:    Semaglutide, 1 MG/DOSE, (OZEMPIC, 1 MG/DOSE,) 4 MG/3ML SOPN, Inject 1 mg into the skin every Monday., Disp: , Rfl:    Vitamin D, Ergocalciferol, (DRISDOL) 1.25 MG (50000 UNIT) CAPS capsule, Take 50,000 Units by mouth every Tuesday., Disp: , Rfl:    vortioxetine HBr (TRINTELLIX) 20 MG TABS tablet, Take 20 mg by  mouth every morning., Disp: , Rfl:   Orders Placed This Encounter  Procedures   Lipid Panel With LDL/HDL Ratio   LDL cholesterol, direct   CMP14+EGFR   There are no Patient Instructions on file for this visit.   --Continue cardiac medications as reconciled in final medication list. --Return in about 1 year (around 06/21/2022) for Follow up, Lipid. Or sooner if needed. --Continue follow-up with your primary care physician regarding the management of your other chronic comorbid conditions.  Patient's questions and concerns were addressed to her satisfaction. She voices understanding of the instructions provided during this encounter.   This note was created using a voice recognition software as a result there may be grammatical errors inadvertently enclosed that do not reflect the nature of this encounter. Every attempt is made to correct such errors.  Rex Kras, [place], [facility]  Pager: [phone] Office: [phone]

## 2022-05-23 NOTE — Progress Notes (Signed)
Nancy Camacho [place] [address] [place] Phone: [phone] Subjective:   IVilma Meckel, am serving as a scribe for Dr. Hulan Saas.  I'm seeing this patient by the request  of:  Reynold Bowen, MD  CC: Thumb pain bilaterally  [id number]  Nancy Camacho is a 61 y.o. female coming in with complaint of B thumb pain. Patient states 2 years ago thumb pain and did prednisone injections and blood sugar stayed at 400 for 10 days. Referred by b Tammy Key, aesthetician. Has taken many antiinflammatories and not really working. Bilateral thumb pain left is worse than right.  Starting to affect daily activities.       Past Medical History:  Diagnosis Date   Anxiety    Chronic back pain    Depression    Diabetes mellitus without complication ([place])    DKA (diabetic ketoacidosis) ([place])    History of Bell's palsy    History of COVID-19    History of seborrhea    ear   Thyroid disease    Past Surgical History:  Procedure Laterality Date   ABDOMINOPLASTY  07/2005   BREAST ENHANCEMENT SURGERY  04/2006   COLONOSCOPY  03/15/2014   POLYPECTOMY     TUBAL LIGATION     Social History   Socioeconomic History   Marital status: Married    Spouse name: Not on file   Number of children: Not on file   Years of education: Not on file   Highest education level: Not on file  Occupational History   Not on file  Tobacco Use   Smoking status: Never   Smokeless tobacco: Never  Vaping Use   Vaping Use: Never used  Substance and Sexual Activity   Alcohol use: No   Drug use: No   Sexual activity: Yes    Birth control/protection: None  Other Topics Concern   Not on file  Social History Narrative   Not on file   Social Determinants of Health   Financial Resource Strain: Not on file  Food Insecurity: Not on file  Transportation Needs: Not on file  Physical Activity: Not on file  Stress: Not on file  Social Connections: Not on file    Allergies  Allergen Reactions   Flexeril [Cyclobenzaprine] Other (See Comments)    "generic flexeril", r/t bell's palsy   Family History  Problem Relation Age of Onset   Diabetes Father    Atrial fibrillation Mother    Colon cancer Neg Hx    Pancreatic cancer Neg Hx    Rectal cancer Neg Hx    Stomach cancer Neg Hx    Colon polyps Neg Hx    Esophageal cancer Neg Hx     Current Outpatient Medications (Endocrine & Metabolic):    estradiol (ESTRACE) 0.5 MG tablet, Take 0.5 mg by mouth daily.   insulin lispro (HUMALOG) 100 UNIT/ML KwikPen, Inject 2-3 Units into the skin 3 (three) times daily with meals.   levothyroxine (SYNTHROID) 75 MCG tablet, Take 75 mcg by mouth every morning.   Semaglutide, 1 MG/DOSE, (OZEMPIC, 1 MG/DOSE,) 4 MG/3ML SOPN, Inject 1 mg into the skin every Monday.  Current Outpatient Medications (Cardiovascular):    atorvastatin (LIPITOR) 20 MG tablet, TAKE ONE TABLET BY MOUTH AT BEDTIME   Current Outpatient Medications (Analgesics):    acetaminophen (TYLENOL) 500 MG tablet, Take 1,000 mg by mouth every 6 (six) hours as needed for headache (pain).   celecoxib (CELEBREX) 200 MG capsule, Take  200 mg by mouth every morning.   Current Outpatient Medications (Other):    ADDERALL XR 20 MG 24 hr capsule, Take 20 mg by mouth every morning.   busPIRone (BUSPAR) 15 MG tablet, Take 15 mg by mouth every morning.   Coenzyme Q10 (CO Q 10 PO), Take 1 tablet by mouth daily at 12 noon.   MAGNESIUM-ZINC PO, Take 1 tablet by mouth at bedtime.   Multiple Vitamins-Minerals (ZINC PO), Take 1 capsule by mouth at bedtime.   ondansetron (ZOFRAN-ODT) 8 MG disintegrating tablet, Take 4-8 mg by mouth 2 (two) times daily.   Vitamin D, Ergocalciferol, (DRISDOL) 1.25 MG (50000 UNIT) CAPS capsule, Take 50,000 Units by mouth every Tuesday.   vortioxetine HBr (TRINTELLIX) 20 MG TABS tablet, Take 20 mg by mouth every morning.   Reviewed prior external information including notes and imaging  from  primary care provider As well as notes that were available from care everywhere and other healthcare systems.  Past medical history, social, surgical and family history all reviewed in electronic medical record.  No pertanent information unless stated regarding to the chief complaint.   Review of Systems:  No headache, visual changes, nausea, vomiting, diarrhea, constipation, dizziness, abdominal pain, skin rash, fevers, chills, night sweats, weight loss, swollen lymph nodes, body aches, joint swelling, chest pain, shortness of breath, mood changes. POSITIVE muscle aches  Objective  Blood pressure 138/80, pulse 98, height '5\' 9"'$  (1.753 m), weight 178 lb (80.7 kg), last menstrual period 09/17/2011, SpO2 99 %.   General: No apparent distress alert and oriented x3 mood and affect normal, dressed appropriately.  HEENT: Pupils equal, extraocular movements intact  Respiratory: Patient's speak in full sentences and does not appear short of breath  Cardiovascular: No lower extremity edema, non tender, no erythema  Patient's CMC joints bilaterally do have positive grind test noted.  No significant thenar eminence wasting noted.  Patient has a negative Finkelstein's.   Limited muscular skeletal ultrasound was performed and interpreted by Hulan Saas, M  Limited ultrasound of the [facility] joints bilaterally do show at least moderate arthritic changes with narrowing of the joint space.  Patient does have moderate hypoechoic changes noted of the joint on the right side.  Patient also has severe on the left side. Impression: CMC arthritis   Impression and Recommendations:

## 2022-05-25 ENCOUNTER — Ambulatory Visit: Payer: 59 | Admitting: Family Medicine

## 2022-05-25 ENCOUNTER — Ambulatory Visit: Payer: Self-pay

## 2022-05-25 VITALS — BP 138/80 | HR 98 | Ht 69.0 in | Wt 178.0 lb

## 2022-05-25 DIAGNOSIS — M79644 Pain in right finger(s): Secondary | ICD-10-CM | POA: Diagnosis not present

## 2022-05-25 DIAGNOSIS — M79645 Pain in left finger(s): Secondary | ICD-10-CM | POA: Diagnosis not present

## 2022-05-25 DIAGNOSIS — M180 Bilateral primary osteoarthritis of first carpometacarpal joints: Secondary | ICD-10-CM | POA: Insufficient documentation

## 2022-05-25 NOTE — Assessment & Plan Note (Addendum)
Arthritic changes of the thumbs bilaterally.  Patient has failed formal physical therapy twice, home exercises, as well as injections from an outside facility that did cause patient to have increase in blood sugars.  Patient does not want to have any more steroid injections.  Still feels like she is generally does not want to have any surgical intervention.  I do feel that patient could respond well to possible PRP that could give some more time as well.  Patient has stated that she would like to consider the PRP and will be scheduled when she has a couple days off from work and will be able to do the post PRP treatment more appropriately.  Total time discussing with patient, looking with ultrasound, and discussing with patient about different treatment options greater than 45 minutes

## 2022-05-25 NOTE — Patient Instructions (Addendum)
Good to see you! PRP when you can. Give [place] a call. Tart Cherry '1200mg'$   and take less ashwagandha

## 2022-06-06 NOTE — Progress Notes (Signed)
[place] Phone: [phone] Subjective:   Nancy Camacho, am serving as a scribe for Dr. Hulan Saas.  I'm seeing this patient by the request  of:  Reynold Bowen, MD  CC: Arthritis of the thumbs bilaterally  OMB:[id number]  05/25/2022 Arthritic changes of the thumbs bilaterally.  Patient has failed formal physical therapy twice, home exercises, as well as injections from an outside facility that did cause patient to have increase in blood sugars.  Patient does not want to have any more steroid injections.  Still feels like she is generally does not want to have any surgical intervention.  I do feel that patient could respond well to possible PRP that could give some more time as well.  Patient has stated that she would like to consider the PRP and will be scheduled when she has a couple days off from work and will be able to do the post PRP treatment more appropriately.  Total time discussing with patient, looking with ultrasound, and discussing with patient about different treatment options greater than 45 minutes  Update 06/08/2022 Nancy Camacho is a 61 y.o. female coming in with complaint of B CMC joint pain. Here for PRP today. Patient states        Past Medical History:  Diagnosis Date   Anxiety    Chronic back pain    Depression    Diabetes mellitus without complication ([place])    DKA (diabetic ketoacidosis) ([place])    History of Bell's palsy    History of COVID-19    History of seborrhea    ear   Thyroid disease    Past Surgical History:  Procedure Laterality Date   ABDOMINOPLASTY  07/2005   BREAST ENHANCEMENT SURGERY  04/2006   COLONOSCOPY  03/15/2014   POLYPECTOMY     TUBAL LIGATION     Social History   Socioeconomic History   Marital status: Married    Spouse name: Not on file   Number of children: Not on file   Years of education: Not on file   Highest education level: Not on file   Occupational History   Not on file  Tobacco Use   Smoking status: Never   Smokeless tobacco: Never  Vaping Use   Vaping Use: Never used  Substance and Sexual Activity   Alcohol use: Camacho   Drug use: Camacho   Sexual activity: Yes    Birth control/protection: None  Other Topics Concern   Not on file  Social History Narrative   Not on file   Social Determinants of Health   Financial Resource Strain: Not on file  Food Insecurity: Not on file  Transportation Needs: Not on file  Physical Activity: Not on file  Stress: Not on file  Social Connections: Not on file   Allergies  Allergen Reactions   Flexeril [Cyclobenzaprine] Other (See Comments)    "generic flexeril", r/t bell's palsy   Family History  Problem Relation Age of Onset   Diabetes Father    Atrial fibrillation Mother    Colon cancer Neg Hx    Pancreatic cancer Neg Hx    Rectal cancer Neg Hx    Stomach cancer Neg Hx    Colon polyps Neg Hx    Esophageal cancer Neg Hx     Current Outpatient Medications (Endocrine & Metabolic):    estradiol (ESTRACE) 0.5 MG tablet, Take 0.5 mg by mouth daily.   insulin lispro (HUMALOG)  100 UNIT/ML KwikPen, Inject 2-3 Units into the skin 3 (three) times daily with meals.   levothyroxine (SYNTHROID) 75 MCG tablet, Take 75 mcg by mouth every morning.   Semaglutide, 1 MG/DOSE, (OZEMPIC, 1 MG/DOSE,) 4 MG/3ML SOPN, Inject 1 mg into the skin every Monday.  Current Outpatient Medications (Cardiovascular):    atorvastatin (LIPITOR) 20 MG tablet, TAKE ONE TABLET BY MOUTH AT BEDTIME   Current Outpatient Medications (Analgesics):    acetaminophen (TYLENOL) 500 MG tablet, Take 1,000 mg by mouth every 6 (six) hours as needed for headache (pain).   celecoxib (CELEBREX) 200 MG capsule, Take 200 mg by mouth every morning.   Current Outpatient Medications (Other):    ADDERALL XR 20 MG 24 hr capsule, Take 20 mg by mouth every morning.   busPIRone (BUSPAR) 15 MG tablet, Take 15 mg by mouth every  morning.   Coenzyme Q10 (CO Q 10 PO), Take 1 tablet by mouth daily at 12 noon.   MAGNESIUM-ZINC PO, Take 1 tablet by mouth at bedtime.   Multiple Vitamins-Minerals (ZINC PO), Take 1 capsule by mouth at bedtime.   ondansetron (ZOFRAN-ODT) 8 MG disintegrating tablet, Take 4-8 mg by mouth 2 (two) times daily.   Vitamin D, Ergocalciferol, (DRISDOL) 1.25 MG (50000 UNIT) CAPS capsule, Take 50,000 Units by mouth every Tuesday.   vortioxetine HBr (TRINTELLIX) 20 MG TABS tablet, Take 20 mg by mouth every morning.   Reviewed prior external information including notes and imaging from  primary care provider As well as notes that were available from care everywhere and other healthcare systems.  Past medical history, social, surgical and family history all reviewed in electronic medical record.  Camacho pertanent information unless stated regarding to the chief complaint.   Review of Systems:  Camacho headache, visual changes, nausea, vomiting, diarrhea, constipation, dizziness, abdominal pain, skin rash, fevers, chills, night sweats, weight loss, swollen lymph nodes, body aches, joint swelling, chest pain, shortness of breath, mood changes. POSITIVE muscle aches  Objective  Last menstrual period 09/17/2011.   General: Camacho apparent distress alert and oriented x3 mood and affect normal, dressed appropriately.  HEENT: Pupils equal, extraocular movements intact  Respiratory: Patient's speak in full sentences and does not appear short of breath  Cardiovascular: Camacho lower extremity edema, non tender, Camacho erythema    Procedure: Real-time Ultrasound Guided Injection of right and left CMC joint Device: GE Logiq Q7 Ultrasound guided injection is preferred based studies that show increased duration, increased effect, greater accuracy, decreased procedural pain, increased response rate, and decreased cost with ultrasound guided versus blind injection.  Verbal informed consent obtained.  Time-out conducted.  Noted Camacho  overlying erythema, induration, or other signs of local infection.  Skin prepped in a sterile fashion.  Local anesthesia: Topical Ethyl chloride.  With sterile technique and under real time ultrasound guidance: With a 21-gauge 2 inch needle patient was injected with 0.5 cc of 0.5% Marcaine and then injected with 2 cc of PRP.  This was repeated on the contralateral side as well. Completed without difficulty  Advised to call if fevers/chills, erythema, induration, drainage, or persistent bleeding.  Impression: Technically successful ultrasound guided injection.    Impression and Recommendations:     The above documentation has been reviewed and is accurate and complete Lyndal Pulley, DO

## 2022-06-08 ENCOUNTER — Ambulatory Visit (INDEPENDENT_AMBULATORY_CARE_PROVIDER_SITE_OTHER): Payer: Self-pay | Admitting: Family Medicine

## 2022-06-08 ENCOUNTER — Ambulatory Visit: Payer: Self-pay

## 2022-06-08 DIAGNOSIS — M180 Bilateral primary osteoarthritis of first carpometacarpal joints: Secondary | ICD-10-CM

## 2022-06-08 NOTE — Assessment & Plan Note (Signed)
Patient did have PRP bilaterally.  Warned of the post PRP protocols.  She is work with Product/process development scientist.  We will follow-up again in 5 to 6 weeks for further evaluation and treatment.

## 2022-06-08 NOTE — Patient Instructions (Signed)
No ice or IBU for 3 days Heat and Tylenol are ok See me again in 6 weeks 

## 2022-06-21 ENCOUNTER — Ambulatory Visit: Payer: 59 | Admitting: Cardiology

## 2022-06-29 ENCOUNTER — Ambulatory Visit: Payer: 59 | Admitting: Cardiology

## 2022-06-29 ENCOUNTER — Encounter: Payer: Self-pay | Admitting: Cardiology

## 2022-06-29 VITALS — BP 117/70 | HR 98 | Temp 97.3°F | Resp 16 | Ht 69.0 in | Wt 170.8 lb

## 2022-06-29 DIAGNOSIS — Z794 Long term (current) use of insulin: Secondary | ICD-10-CM

## 2022-06-29 DIAGNOSIS — Z8616 Personal history of COVID-19: Secondary | ICD-10-CM

## 2022-06-29 DIAGNOSIS — E039 Hypothyroidism, unspecified: Secondary | ICD-10-CM

## 2022-06-29 DIAGNOSIS — E782 Mixed hyperlipidemia: Secondary | ICD-10-CM

## 2022-06-29 DIAGNOSIS — R9431 Abnormal electrocardiogram [ECG] [EKG]: Secondary | ICD-10-CM

## 2022-06-29 DIAGNOSIS — E1165 Type 2 diabetes mellitus with hyperglycemia: Secondary | ICD-10-CM

## 2022-06-29 NOTE — Progress Notes (Signed)
ID:  Nancy Camacho, DOB Aug 13, 1961, MRN [id number]  PCP:  Reynold Bowen, MD  Cardiologist: Rex Kras, DO, [facility] (established care 04/06/2021)  Date: 06/29/22 Last Office Visit: 06/21/2021   Chief Complaint  Patient presents with   Hyperlipidemia   Follow-up    1 year follow-up visit    HPI  Nancy Camacho is a 61 y.o. female whose past medical history and cardiovascular risk factors include: Hyperlipidemia, Insulin-dependent diabetes mellitus LADA, hypothyroidism, anxiety/depression, back pain, COVID-19 infection, hormone replacement therapy, postmenopausal female.  Was initially referred to the practice for evaluation of elevated troponins after her hospitalization in February 2022 for diabetic ketoacidosis and COVID-19 infection.  Since establishing care patient has undergone an ischemic evaluation which is outlined below but in summary LVEF is preserved, total coronary calcium score is 0, and CAD RADS 0 for coronary CTA.  Given her history of insulin-dependent diabetes she was recommended to be on statin therapy to reduce her LDL.  She was on atorvastatin 20 mg p.o. nightly at the last office visit; however, due to myalgias she discontinued it.  She started taking red yeast rice and has done well.  Outside labs independently reviewed and noted below for further reference.  LDL within acceptable range.  Since last office visit patient denies any anginal discomfort or heart failure symptoms over the last 1 year.  Her overall functional capacity remains relatively stable.  FUNCTIONAL STATUS: No structured exercise program or daily routine.   ALLERGIES: Allergies  Allergen Reactions   Flexeril [Cyclobenzaprine] Other (See Comments)    "generic flexeril", r/t bell's palsy    MEDICATION LIST PRIOR TO VISIT: Current Meds  Medication Sig   acetaminophen (TYLENOL) 500 MG tablet Take 1,000 mg by mouth every 6 (six) hours as needed for headache (pain).   ADDERALL XR 20 MG 24 hr  capsule Take 20 mg by mouth every morning.   busPIRone (BUSPAR) 15 MG tablet Take 15 mg by mouth every morning.   estradiol (ESTRACE) 0.5 MG tablet Take 0.5 mg by mouth daily.   insulin lispro (HUMALOG) 100 UNIT/ML KwikPen Inject 2-3 Units into the skin 3 (three) times daily with meals.   levothyroxine (SYNTHROID) 75 MCG tablet Take 75 mcg by mouth every morning.   MAGNESIUM-ZINC PO Take 1 tablet by mouth at bedtime.   NON FORMULARY Take 1 tablet by mouth daily at 12 noon. Tart cherry extract 1200 mg   ondansetron (ZOFRAN-ODT) 8 MG disintegrating tablet Take 4-8 mg by mouth 2 (two) times daily.   Red Yeast Rice Extract (RED YEAST RICE PO) Take 1,200 mg by mouth daily in the afternoon.   Semaglutide, 1 MG/DOSE, (OZEMPIC, 1 MG/DOSE,) 4 MG/3ML SOPN Inject 1 mg into the skin every 7 (seven) days.   Vitamin D, Ergocalciferol, (DRISDOL) 1.25 MG (50000 UNIT) CAPS capsule Take 50,000 Units by mouth every Tuesday.   vortioxetine HBr (TRINTELLIX) 20 MG TABS tablet Take 20 mg by mouth every morning.     PAST MEDICAL HISTORY: Past Medical History:  Diagnosis Date   Anxiety    Chronic back pain    Depression    Diabetes mellitus without complication ([place])    DKA (diabetic ketoacidosis) ([place])    History of Bell's palsy    History of COVID-19    History of seborrhea    ear   Thyroid disease     PAST SURGICAL HISTORY: Past Surgical History:  Procedure Laterality Date   ABDOMINOPLASTY  07/2005   BREAST ENHANCEMENT SURGERY  04/2006   COLONOSCOPY  03/15/2014   POLYPECTOMY     TUBAL LIGATION      FAMILY HISTORY: The patient family history includes Atrial fibrillation in her mother; Diabetes in her father.  SOCIAL HISTORY:  The patient  reports that she has never smoked. She has never used smokeless tobacco. She reports that she does not drink alcohol and does not use drugs.  REVIEW OF SYSTEMS: Review of Systems  Constitutional: Negative for chills and fever.  HENT:  Negative for hoarse  voice and nosebleeds.   Eyes:  Negative for discharge, double vision and pain.  Cardiovascular:  Negative for chest pain, claudication, dyspnea on exertion, leg swelling, near-syncope, orthopnea, palpitations, paroxysmal nocturnal dyspnea and syncope.  Respiratory:  Negative for hemoptysis and shortness of breath.   Musculoskeletal:  Negative for muscle cramps and myalgias.  Gastrointestinal:  Negative for abdominal pain, constipation, diarrhea, hematemesis, hematochezia, melena, nausea and vomiting.  Neurological:  Negative for dizziness and light-headedness.    PHYSICAL EXAM:    06/29/2022    2:24 PM 06/08/2022    7:40 AM 05/25/2022    8:02 AM  Vitals with BMI  Height _0  _1  _2   Weight 170 lbs 13 oz  178 lbs  BMI 10.25  85.27  Systolic 782 423 536  Diastolic 70 82 80  Pulse 98 75 98    CONSTITUTIONAL: Well-developed and well-nourished. No acute distress.  SKIN: Skin is warm and dry. No rash noted. No cyanosis. No pallor. No jaundice HEAD: Normocephalic and atraumatic.  EYES: No scleral icterus MOUTH/THROAT: Moist oral membranes.  NECK: No JVD present. No thyromegaly noted. No carotid bruits  LYMPHATIC: No visible cervical adenopathy.  CHEST Normal respiratory effort. No intercostal retractions  LUNGS: Clear to auscultation bilaterally.  No stridor. No wheezes. No rales.  CARDIOVASCULAR: Regular rate and rhythm, positive S1-S2, no murmurs rubs or gallops appreciated ABDOMINAL: No apparent ascites.  EXTREMITIES: No peripheral edema  HEMATOLOGIC: No significant bruising NEUROLOGIC: Oriented to person, place, and time. Nonfocal. Normal muscle tone.  PSYCHIATRIC: Normal mood and affect. Normal behavior. Cooperative  CARDIAC DATABASE: EKG: 06/29/2022: Normal sinus rhythm, 85 bpm, LAE without underlying ischemia or injury pattern.  Echocardiogram: 01/16/2021: Limited echocardiogram LVEF 70-75%, right ventricular systolic size and function normal.  No significant valvular  heart disease.  04/27/2021: Normal LV systolic function with visual EF 55-60%. Left ventricle cavity is normal in size. Normal left ventricular wall thickness. Normal global wall motion. Normal diastolic filling pattern, normal LAP.  No significant valvular heart disease. Compared to prior study 01/16/2021 no significant change.   Stress Testing: No results found for this or any previous visit from the past 1095 days.  CCTA 04/18/2021: 1. Total coronary calcium score of 0. 2. Normal coronary origin with right dominance. 3. CAD-RADS = 0. Non cardiac findings: Left breast nodule of 1.1 cm, partially calcified. Possibly a fibroadenoma, but nonspecific. Correlate with diagnostic mammogram and ultrasound.  Heart Catheterization: None  LABORATORY DATA:    Latest Ref Rng & Units 01/17/2021    2:50 AM 01/16/2021    6:42 AM 01/15/2021    2:00 PM  CBC  WBC 4.0 - 10.5 K/uL 5.2  9.4  15.5   Hemoglobin 12.0 - 15.0 g/dL 12.3  12.1  13.3   Hematocrit 36.0 - 46.0 % 36.3  36.3  42.7   Platelets 150 - 400 K/uL 211  237  291        Latest Ref Rng & Units 01/18/2021  2:50 AM 01/17/2021    2:50 AM 01/16/2021    6:42 AM  CMP  Glucose 70 - 99 mg/dL 106  96  163   BUN 6 - 20 mg/dL _0 Creatinine 0.44 - 1.00 mg/dL 0.50  0.53  0.71   Sodium 135 - 145 mmol/L 137  136  140   Potassium 3.5 - 5.1 mmol/L 3.0  2.9  3.4   Chloride 98 - 111 mmol/L 102  100  101   CO2 22 - 32 mmol/L _1 Calcium 8.9 - 10.3 mg/dL 8.3  8.1  8.5   Total Protein 6.5 - 8.1 g/dL  5.3    Total Bilirubin 0.3 - 1.2 mg/dL  1.0    Alkaline Phos 38 - 126 U/L  46    AST 15 - 41 U/L  57    ALT 0 - 44 U/L  49      Lipid Panel     Component Value Date/Time   CHOL 135 01/17/2021 0250   TRIG 67 01/17/2021 0250   HDL 47 01/17/2021 0250   CHOLHDL 2.9 01/17/2021 0250   VLDL 13 01/17/2021 0250   LDLCALC 75 01/17/2021 0250    No components found for: "NTPROBNP" No results for input(s): "PROBNP" in the last 8760  hours. No results for input(s): "TSH" in the last 8760 hours.   BMP No results for input(s): "NA", "K", "CL", "CO2", "GLUCOSE", "BUN", "CREATININE", "CALCIUM", "GFRNONAA", "GFRAA" in the last 8760 hours.   HEMOGLOBIN A1C Lab Results  Component Value Date   HGBA1C 7.7 (H) 01/16/2021   MPG 174.29 01/16/2021   External Labs:  Date Collected: 03/23/2021 , information obtained by [facility] Potassium: 4.7 Creatinine 0.8 mg/dL. eGFR: 73.9 mL/min per 1.73 m AST: 20 , ALT: 25 , alkaline phosphatase: 76  TSH: 1.06  08/31/2020: Hemoglobin: 14.4 g/dL and hematocrit: 44.6 %  01/12/2021: Hemoglobin A1c: 7.0  External Labs: Collected: 06/22/2022. A1c 6.7. Total cholesterol 137, triglycerides 34, HDL 74, LDL 56, non-HDL 63 BUN 16, creatinine 0.7. eGFR 85.6. Sodium 139, potassium 4.2, chloride 106, bicarb 28, AST 15, ALT 18, alkaline phosphatase 73. Hemoglobin 12.8 g/dL, 37.7% hematocrit.   TSH 1.08.   IMPRESSION:    ICD-10-CM   1. Mixed hyperlipidemia  E78.2 EKG 12-Lead    2. Abnormal EKG  R94.31     3. Type 2 diabetes mellitus with hyperglycemia, with long-term current use of insulin (HCC)  E11.65    Z79.4     4. Hypothyroidism, unspecified type  E03.9     5. History of COVID-19  Z86.16        RECOMMENDATIONS: Evalene JERITA WIMBUSH is a 61 y.o. female whose past medical history and cardiac risk factors include:  Insulin-dependent diabetes mellitus LADA, hypothyroidism, anxiety/depression, back pain, COVID-19 infection, hormone replacement therapy, postmenopausal female.  Previously referred to the practice for evaluation of elevated troponins which were noted in the setting of diabetic ketoacidosis and COVID-19 infection.  Given her cardiovascular risk factors as outlined above she underwent ischemic work-up which was essentially unremarkable.  Patient was educated on the importance of improving her modifiable cardiovascular risk factors.  Given her diabetes and  prior cholesterol levels she was recommended to be on statin therapy.  However due to myalgias patient discontinued Lipitor over the last 1 year and has started red yeast rice.  When she followed up for her well visit with PCP she had labs performed which  note good control of her LDL with current therapy and lifestyle changes of weight loss.  EKG shows sinus rhythm without underlying injury pattern.  Her cardiovascular work-up is quite recent and no additional work-up is warranted as she is asymptomatic.  I would like to see her back on an annual basis or as needed going forward.  The shared decision was to continue follow-up annually or sooner if needed.  FINAL MEDICATION LIST END OF ENCOUNTER: No orders of the defined types were placed in this encounter.   Medications Discontinued During This Encounter  Medication Reason   Semaglutide, 1 MG/DOSE, (OZEMPIC, 1 MG/DOSE,) 4 MG/3ML SOPN    atorvastatin (LIPITOR) 20 MG tablet    Multiple Vitamins-Minerals (ZINC PO)    Coenzyme Q10 (CO Q 10 PO)    celecoxib (CELEBREX) 200 MG capsule      Current Outpatient Medications:    acetaminophen (TYLENOL) 500 MG tablet, Take 1,000 mg by mouth every 6 (six) hours as needed for headache (pain)., Disp: , Rfl:    ADDERALL XR 20 MG 24 hr capsule, Take 20 mg by mouth every morning., Disp: , Rfl:    busPIRone (BUSPAR) 15 MG tablet, Take 15 mg by mouth every morning., Disp: , Rfl:    estradiol (ESTRACE) 0.5 MG tablet, Take 0.5 mg by mouth daily., Disp: , Rfl:    insulin lispro (HUMALOG) 100 UNIT/ML KwikPen, Inject 2-3 Units into the skin 3 (three) times daily with meals., Disp: , Rfl:    levothyroxine (SYNTHROID) 75 MCG tablet, Take 75 mcg by mouth every morning., Disp: , Rfl:    MAGNESIUM-ZINC PO, Take 1 tablet by mouth at bedtime., Disp: , Rfl:    NON FORMULARY, Take 1 tablet by mouth daily at 12 noon. Tart cherry extract 1200 mg, Disp: , Rfl:    ondansetron (ZOFRAN-ODT) 8 MG disintegrating tablet, Take 4-8  mg by mouth 2 (two) times daily., Disp: , Rfl:    Red Yeast Rice Extract (RED YEAST RICE PO), Take 1,200 mg by mouth daily in the afternoon., Disp: , Rfl:    Semaglutide, 1 MG/DOSE, (OZEMPIC, 1 MG/DOSE,) 4 MG/3ML SOPN, Inject 1 mg into the skin every 7 (seven) days., Disp: , Rfl:    Vitamin D, Ergocalciferol, (DRISDOL) 1.25 MG (50000 UNIT) CAPS capsule, Take 50,000 Units by mouth every Tuesday., Disp: , Rfl:    vortioxetine HBr (TRINTELLIX) 20 MG TABS tablet, Take 20 mg by mouth every morning., Disp: , Rfl:   Orders Placed This Encounter  Procedures   EKG 12-Lead   There are no Patient Instructions on file for this visit.   --Continue cardiac medications as reconciled in final medication list. --Return in about 1 year (around 06/30/2023) for Annual follow up . Or sooner if needed. --Continue follow-up with your primary care physician regarding the management of your other chronic comorbid conditions.  Patient's questions and concerns were addressed to her satisfaction. She voices understanding of the instructions provided during this encounter.   This note was created using a voice recognition software as a result there may be grammatical errors inadvertently enclosed that do not reflect the nature of this encounter. Every attempt is made to correct such errors.  Rex Kras, [place], [facility]  Pager: [phone] Office: [phone]

## 2022-08-01 NOTE — Progress Notes (Signed)
[place] Phone: [phone] Subjective:   Fontaine No, am serving as a scribe for Dr. Hulan Saas.  I'm seeing this patient by the request  of:  Reynold Bowen, MD  CC: Bilateral thumb pain follow-up  [id number]  06/08/2022 Patient did have PRP bilaterally.  Warned of the post PRP protocols.  She is work with Product/process development scientist.  We will follow-up again in 5 to 6 weeks for further evaluation and treatment.  Update 08/02/2022 Roshawna ANGELIE KRAM is a 61 y.o. female coming in with complaint of B CMC joint pain. Patient states that she did not have any relief from PRP injections. Notes an increase in swelling in both sides. Patient quit using NSAIDs. Using tart cherry extract and osteo bi-flex.        Past Medical History:  Diagnosis Date   Anxiety    Chronic back pain    Depression    Diabetes mellitus without complication ([place])    DKA (diabetic ketoacidosis) ([place])    History of Bell's palsy    History of COVID-19    History of seborrhea    ear   Thyroid disease    Past Surgical History:  Procedure Laterality Date   ABDOMINOPLASTY  07/2005   BREAST ENHANCEMENT SURGERY  04/2006   COLONOSCOPY  03/15/2014   POLYPECTOMY     TUBAL LIGATION     Social History   Socioeconomic History   Marital status: Married    Spouse name: Not on file   Number of children: Not on file   Years of education: Not on file   Highest education level: Not on file  Occupational History   Not on file  Tobacco Use   Smoking status: Never   Smokeless tobacco: Never  Vaping Use   Vaping Use: Never used  Substance and Sexual Activity   Alcohol use: No   Drug use: No   Sexual activity: Yes    Birth control/protection: None  Other Topics Concern   Not on file  Social History Narrative   Not on file   Social Determinants of Health   Financial Resource Strain: Not on file  Food Insecurity: Not on file   Transportation Needs: Not on file  Physical Activity: Not on file  Stress: Not on file  Social Connections: Not on file   Allergies  Allergen Reactions   Flexeril [Cyclobenzaprine] Other (See Comments)    "generic flexeril", r/t bell's palsy   Family History  Problem Relation Age of Onset   Diabetes Father    Atrial fibrillation Mother    Colon cancer Neg Hx    Pancreatic cancer Neg Hx    Rectal cancer Neg Hx    Stomach cancer Neg Hx    Colon polyps Neg Hx    Esophageal cancer Neg Hx     Current Outpatient Medications (Endocrine & Metabolic):    estradiol (ESTRACE) 0.5 MG tablet, Take 0.5 mg by mouth daily.   insulin lispro (HUMALOG) 100 UNIT/ML KwikPen, Inject 2-3 Units into the skin 3 (three) times daily with meals.   levothyroxine (SYNTHROID) 75 MCG tablet, Take 75 mcg by mouth every morning.   Semaglutide, 1 MG/DOSE, (OZEMPIC, 1 MG/DOSE,) 4 MG/3ML SOPN, Inject 1 mg into the skin every 7 (seven) days.    Current Outpatient Medications (Analgesics):    acetaminophen (TYLENOL) 500 MG tablet, Take 1,000 mg by mouth every 6 (six) hours as needed for headache (pain).  Current Outpatient Medications (Other):    ADDERALL XR 20 MG 24 hr capsule, Take 20 mg by mouth every morning.   busPIRone (BUSPAR) 15 MG tablet, Take 15 mg by mouth every morning.   MAGNESIUM-ZINC PO, Take 1 tablet by mouth at bedtime.   NON FORMULARY, Take 1 tablet by mouth daily at 12 noon. Tart cherry extract 1200 mg   ondansetron (ZOFRAN-ODT) 8 MG disintegrating tablet, Take 4-8 mg by mouth 2 (two) times daily.   Red Yeast Rice Extract (RED YEAST RICE PO), Take 1,200 mg by mouth daily in the afternoon.   Vitamin D, Ergocalciferol, (DRISDOL) 1.25 MG (50000 UNIT) CAPS capsule, Take 50,000 Units by mouth every Tuesday.   vortioxetine HBr (TRINTELLIX) 20 MG TABS tablet, Take 20 mg by mouth every morning.   Reviewed prior external information including notes and imaging from  primary care provider As well  as notes that were available from care everywhere and other healthcare systems.  Past medical history, social, surgical and family history all reviewed in electronic medical record.  No pertanent information unless stated regarding to the chief complaint.   Review of Systems:  No headache, visual changes, nausea, vomiting, diarrhea, constipation, dizziness, abdominal pain, skin rash, fevers, chills, night sweats, weight loss, swollen lymph nodes, body aches, joint swelling, chest pain, shortness of breath, mood changes. POSITIVE muscle aches  Objective  Blood pressure 122/76, pulse (!) 105, height '5\' 9"'$  (1.753 m), weight 170 lb (77.1 kg), last menstrual period 09/17/2011.   General: No apparent distress alert and oriented x3 mood and affect normal, dressed appropriately.  HEENT: Pupils equal, extraocular movements intact  Respiratory: Patient's speak in full sentences and does not appear short of breath  Cardiovascular: No lower extremity edema, non tender, no erythema  Lateral thumb pain does have some thenar eminence wasting noted.  Patient still has swelling noted of the left thumb compared to the contralateral side.  Still positive grind test bilaterally.  Limited muscular skeletal ultrasound was performed and interpreted by Hulan Saas, M  Limited ultrasound on patient's right thumb shows significant decrease in hypoechoic changes and appears patient does have some mild calcific changes noted deep into the joint.  Patient's left CMC joint continues to have hypoechoic changes and an effusion noted.  Patient does have a large bone spur noted that is fairly significant and causing impingement with dynamic testing. Impression: Interval improvement of the right side, no improvement on the contralateral side    Impression and Recommendations:

## 2022-08-02 ENCOUNTER — Ambulatory Visit: Payer: Self-pay

## 2022-08-02 ENCOUNTER — Ambulatory Visit: Payer: 59 | Admitting: Family Medicine

## 2022-08-02 ENCOUNTER — Encounter: Payer: Self-pay | Admitting: Family Medicine

## 2022-08-02 ENCOUNTER — Ambulatory Visit (INDEPENDENT_AMBULATORY_CARE_PROVIDER_SITE_OTHER): Payer: 59

## 2022-08-02 VITALS — BP 122/76 | HR 105 | Ht 69.0 in | Wt 170.0 lb

## 2022-08-02 DIAGNOSIS — M180 Bilateral primary osteoarthritis of first carpometacarpal joints: Secondary | ICD-10-CM | POA: Diagnosis not present

## 2022-08-02 DIAGNOSIS — M79645 Pain in left finger(s): Secondary | ICD-10-CM

## 2022-08-02 DIAGNOSIS — M79644 Pain in right finger(s): Secondary | ICD-10-CM

## 2022-08-02 DIAGNOSIS — M2550 Pain in unspecified joint: Secondary | ICD-10-CM | POA: Diagnosis not present

## 2022-08-02 LAB — IBC PANEL
Iron: 105 ug/dL (ref 42–145)
Saturation Ratios: 30.9 % (ref 20.0–50.0)
TIBC: 340.2 ug/dL (ref 250.0–450.0)
Transferrin: 243 mg/dL (ref 212.0–360.0)

## 2022-08-02 LAB — SEDIMENTATION RATE: Sed Rate: 3 mm/hr (ref 0–30)

## 2022-08-02 LAB — URIC ACID: Uric Acid, Serum: 3.5 mg/dL (ref 2.4–7.0)

## 2022-08-02 LAB — VITAMIN B12: Vitamin B-12: 221 pg/mL (ref 211–911)

## 2022-08-02 LAB — FERRITIN: Ferritin: 23.6 ng/mL (ref 10.0–291.0)

## 2022-08-02 LAB — TSH: TSH: 1.18 u[IU]/mL (ref 0.35–5.50)

## 2022-08-02 NOTE — Patient Instructions (Addendum)
Xray today: May talk to Dr. Amede[facility] today Follow up appointment just in case

## 2022-08-02 NOTE — Assessment & Plan Note (Signed)
Patient has not responded extremely well to the PRP.  Does have significant decrease in hypoechoic changes noted on the right side but not as much on the left side.  Discussed with patient about different treatment options.  We discussed the possibility of repeating.  We will get x-rays to further evaluate.  Patient does have some bone spurring occurring and may be there is a chance that we could see if there is can removed the bone spurs and see how patient would respond.  After the x-rays will see.  We will also get laboratory work-up to make sure nothing else is potentially contributing to some of the discomfort and pain as well.  Follow-up again in 6 to 8 weeks.

## 2022-08-03 LAB — PTH, INTACT AND CALCIUM
Calcium: 9 mg/dL (ref 8.6–10.4)
PTH: 33 pg/mL (ref 16–77)

## 2022-08-03 LAB — ANGIOTENSIN CONVERTING ENZYME: Angiotensin-Converting Enzyme: 48 U/L (ref 9–67)

## 2022-08-03 LAB — RHEUMATOID FACTOR: Rheumatoid fact SerPl-aCnc: <14 IU/mL (ref <14)

## 2022-08-03 LAB — ANA: Anti Nuclear Antibody (ANA): NEGATIVE

## 2022-09-19 NOTE — Progress Notes (Unsigned)
[place] Phone: [phone] Subjective:   Fontaine No, am serving as a scribe for Dr. Hulan Saas.  I'm seeing this patient by the request  of:  Reynold Bowen, MD  CC: Bilateral thumb pain  [id number]  08/02/2022 Patient has not responded extremely well to the PRP.  Does have significant decrease in hypoechoic changes noted on the right side but not as much on the left side.  Discussed with patient about different treatment options.  We discussed the possibility of repeating.  We will get x-rays to further evaluate.  Patient does have some bone spurring occurring and may be there is a chance that we could see if there is can removed the bone spurs and see how patient would respond.  After the x-rays will see.  We will also get laboratory work-up to make sure nothing else is potentially contributing to some of the discomfort and pain as well.  Follow-up again in 6 to 8 weeks.  Update 09/20/2022 Renleigh ORIE CUTTINO is a 61 y.o. female coming in with complaint of B thumb pain. Patient states thumb pain has decreased in frequency and intensity. More of a sore feeling now. Less pain with movement. Still taking tart cherry, B complex, and Celebrex. Would like a refill. No other complaints.      Past Medical History:  Diagnosis Date   Anxiety    Chronic back pain    Depression    Diabetes mellitus without complication ([place])    DKA (diabetic ketoacidosis) ([place])    History of Bell's palsy    History of COVID-19    History of seborrhea    ear   Thyroid disease    Past Surgical History:  Procedure Laterality Date   ABDOMINOPLASTY  07/2005   BREAST ENHANCEMENT SURGERY  04/2006   COLONOSCOPY  03/15/2014   POLYPECTOMY     TUBAL LIGATION     Social History   Socioeconomic History   Marital status: Married    Spouse name: Not on file   Number of children: Not on file   Years of education: Not on file   Highest  education level: Not on file  Occupational History   Not on file  Tobacco Use   Smoking status: Never   Smokeless tobacco: Never  Vaping Use   Vaping Use: Never used  Substance and Sexual Activity   Alcohol use: No   Drug use: No   Sexual activity: Yes    Birth control/protection: None  Other Topics Concern   Not on file  Social History Narrative   Not on file   Social Determinants of Health   Financial Resource Strain: Not on file  Food Insecurity: Not on file  Transportation Needs: Not on file  Physical Activity: Not on file  Stress: Not on file  Social Connections: Not on file   Allergies  Allergen Reactions   Flexeril [Cyclobenzaprine] Other (See Comments)    "generic flexeril", r/t bell's palsy   Family History  Problem Relation Age of Onset   Diabetes Father    Atrial fibrillation Mother    Colon cancer Neg Hx    Pancreatic cancer Neg Hx    Rectal cancer Neg Hx    Stomach cancer Neg Hx    Colon polyps Neg Hx    Esophageal cancer Neg Hx     Current Outpatient Medications (Endocrine & Metabolic):    estradiol (ESTRACE) 0.5 MG tablet, Take  0.5 mg by mouth daily.   insulin lispro (HUMALOG) 100 UNIT/ML KwikPen, Inject 2-3 Units into the skin 3 (three) times daily with meals.   levothyroxine (SYNTHROID) 75 MCG tablet, Take 75 mcg by mouth every morning.   Semaglutide, 1 MG/DOSE, (OZEMPIC, 1 MG/DOSE,) 4 MG/3ML SOPN, Inject 1 mg into the skin every 7 (seven) days.    Current Outpatient Medications (Analgesics):    celecoxib (CELEBREX) 200 MG capsule, Take 1 capsule (200 mg total) by mouth 2 (two) times daily.   acetaminophen (TYLENOL) 500 MG tablet, Take 1,000 mg by mouth every 6 (six) hours as needed for headache (pain).   Current Outpatient Medications (Other):    ADDERALL XR 20 MG 24 hr capsule, Take 20 mg by mouth every morning.   busPIRone (BUSPAR) 15 MG tablet, Take 15 mg by mouth every morning.   MAGNESIUM-ZINC PO, Take 1 tablet by mouth at bedtime.    NON FORMULARY, Take 1 tablet by mouth daily at 12 noon. Tart cherry extract 1200 mg   ondansetron (ZOFRAN-ODT) 8 MG disintegrating tablet, Take 4-8 mg by mouth 2 (two) times daily.   Red Yeast Rice Extract (RED YEAST RICE PO), Take 1,200 mg by mouth daily in the afternoon.   Vitamin D, Ergocalciferol, (DRISDOL) 1.25 MG (50000 UNIT) CAPS capsule, Take 50,000 Units by mouth every Tuesday.   vortioxetine HBr (TRINTELLIX) 20 MG TABS tablet, Take 20 mg by mouth every morning.   Reviewed prior external information including notes and imaging from  primary care provider As well as notes that were available from care everywhere and other healthcare systems.  Past medical history, social, surgical and family history all reviewed in electronic medical record.  No pertanent information unless stated regarding to the chief complaint.   Review of Systems:  No headache, visual changes, nausea, vomiting, diarrhea, constipation, dizziness, abdominal pain, skin rash, fevers, chills, night sweats, weight loss, swollen lymph nodes, body aches, joint swelling, chest pain, shortness of breath, mood changes. POSITIVE muscle aches  Objective  Blood pressure 124/80, pulse 100, height '5\' 9"'$  (1.753 m), weight 167 lb (75.8 kg), last menstrual period 09/17/2011, SpO2 98 %.   General: No apparent distress alert and oriented x3 mood and affect normal, dressed appropriately.  HEENT: Pupils equal, extraocular movements intact  Respiratory: Patient's speak in full sentences and does not appear short of breath  Cardiovascular: No lower extremity edema, non tender, no erythema  Bilateral thumbs do not have any significant inflammation at the moment.  Still mild positive noted.  Limited muscular skeletal ultrasound was performed and interpreted by Hulan Saas, M  Limited ultrasound shows that patient has Significant decrease in the hypoechoic changes seen previously as well as calcific changes noted.  This is  bilaterally. Impression: Interval improvement    Impression and Recommendations:     The above documentation has been reviewed and is accurate and complete Lyndal Pulley, DO

## 2022-09-20 ENCOUNTER — Ambulatory Visit: Payer: Self-pay

## 2022-09-20 ENCOUNTER — Encounter: Payer: Self-pay | Admitting: Family Medicine

## 2022-09-20 ENCOUNTER — Ambulatory Visit (INDEPENDENT_AMBULATORY_CARE_PROVIDER_SITE_OTHER): Payer: 59 | Admitting: Family Medicine

## 2022-09-20 VITALS — BP 124/80 | HR 100 | Ht 69.0 in | Wt 167.0 lb

## 2022-09-20 DIAGNOSIS — M79644 Pain in right finger(s): Secondary | ICD-10-CM | POA: Diagnosis not present

## 2022-09-20 DIAGNOSIS — M79645 Pain in left finger(s): Secondary | ICD-10-CM

## 2022-09-20 DIAGNOSIS — M180 Bilateral primary osteoarthritis of first carpometacarpal joints: Secondary | ICD-10-CM | POA: Diagnosis not present

## 2022-09-20 MED ORDER — CELECOXIB 200 MG PO CAPS: 200.0000 mg | ORAL_CAPSULE | Freq: Two times a day (BID) | ORAL | 3 refills | Status: DC

## 2022-09-20 NOTE — Assessment & Plan Note (Addendum)
Limited ultrasound does show the patient has made significant improvements since the PRP injection and may just take more time than what anticipated.  We did discuss with her about the possibility of repeating the PRP.  After the discussion we have decided that patient should continue to just do what she is doing at this time and follow-up in 2 to 3 months did refill patient's Celebrex and discussed taking a 5-day burst.  Dosing is 200 mg twice a day.

## 2022-09-20 NOTE — Patient Instructions (Addendum)
Celebrex refilled So glad you're doing well See you again in 3 months just in case If you want PRP just write [place]

## 2022-12-18 NOTE — Progress Notes (Unsigned)
[place] Phone: [phone] Subjective:   Fontaine No, am serving as a scribe for Dr. Hulan Saas.  I'm seeing this patient by the request  of:  Reynold Bowen, MD  CC: Bilateral thumb pain  [id number]  09/20/2022 Limited ultrasound does show the patient has made significant improvements since the PRP injection and may just take more time than what anticipated.  We did discuss with her about the possibility of repeating the PRP.  After the discussion we have decided that patient should continue to just do what she is doing at this time and follow-up in 2 to 3 months did refill patient's Celebrex and discussed taking a 5-day burst.  Dosing is 200 mg twice a day.      Update 12/20/2022 Nancy Camacho is a 62 y.o. female coming in with complaint of B thumb pain. Patient states  Continues to thumb pain.  Seems to be worsening slowly.  Can feel swelling     Past Medical History:  Diagnosis Date   Anxiety    Chronic back pain    Depression    Diabetes mellitus without complication ([place])    DKA (diabetic ketoacidosis) ([place])    History of Bell's palsy    History of COVID-19    History of seborrhea    ear   Thyroid disease    Past Surgical History:  Procedure Laterality Date   ABDOMINOPLASTY  07/2005   BREAST ENHANCEMENT SURGERY  04/2006   COLONOSCOPY  03/15/2014   POLYPECTOMY     TUBAL LIGATION     Social History   Socioeconomic History   Marital status: Married    Spouse name: Not on file   Number of children: Not on file   Years of education: Not on file   Highest education level: Not on file  Occupational History   Not on file  Tobacco Use   Smoking status: Never   Smokeless tobacco: Never  Vaping Use   Vaping Use: Never used  Substance and Sexual Activity   Alcohol use: No   Drug use: No   Sexual activity: Yes    Birth control/protection: None  Other Topics Concern   Not on  file  Social History Narrative   Not on file   Social Determinants of Health   Financial Resource Strain: Not on file  Food Insecurity: Not on file  Transportation Needs: Not on file  Physical Activity: Not on file  Stress: Not on file  Social Connections: Not on file   Allergies  Allergen Reactions   Flexeril [Cyclobenzaprine] Other (See Comments)    "generic flexeril", r/t bell's palsy   Family History  Problem Relation Age of Onset   Diabetes Father    Atrial fibrillation Mother    Colon cancer Neg Hx    Pancreatic cancer Neg Hx    Rectal cancer Neg Hx    Stomach cancer Neg Hx    Colon polyps Neg Hx    Esophageal cancer Neg Hx     Current Outpatient Medications (Endocrine & Metabolic):    estradiol (ESTRACE) 0.5 MG tablet, Take 0.5 mg by mouth daily.   insulin lispro (HUMALOG) 100 UNIT/ML KwikPen, Inject 2-3 Units into the skin 3 (three) times daily with meals.   levothyroxine (SYNTHROID) 75 MCG tablet, Take 75 mcg by mouth every morning.   Semaglutide, 1 MG/DOSE, (OZEMPIC, 1 MG/DOSE,) 4 MG/3ML SOPN, Inject 1 mg into the skin every  7 (seven) days.    Current Outpatient Medications (Analgesics):    acetaminophen (TYLENOL) 500 MG tablet, Take 1,000 mg by mouth every 6 (six) hours as needed for headache (pain).   celecoxib (CELEBREX) 200 MG capsule, Take 1 capsule (200 mg total) by mouth 2 (two) times daily.   Current Outpatient Medications (Other):    ADDERALL XR 20 MG 24 hr capsule, Take 20 mg by mouth every morning.   busPIRone (BUSPAR) 15 MG tablet, Take 15 mg by mouth every morning.   MAGNESIUM-ZINC PO, Take 1 tablet by mouth at bedtime.   NON FORMULARY, Take 1 tablet by mouth daily at 12 noon. Tart cherry extract 1200 mg   ondansetron (ZOFRAN-ODT) 8 MG disintegrating tablet, Take 4-8 mg by mouth 2 (two) times daily.   Red Yeast Rice Extract (RED YEAST RICE PO), Take 1,200 mg by mouth daily in the afternoon.   Vitamin D, Ergocalciferol, (DRISDOL) 1.25 MG (50000  UNIT) CAPS capsule, Take 50,000 Units by mouth every Tuesday.   vortioxetine HBr (TRINTELLIX) 20 MG TABS tablet, Take 20 mg by mouth every morning.   Objective  Height '5\' 9"'$  (1.753 m), weight 167 lb (75.8 kg), last menstrual period 09/17/2011.   General: No apparent distress alert and oriented x3 mood and affect normal, dressed appropriately.   Procedure: Real-time Ultrasound Guided Injection of right CMC joint Device: GE Logiq Q7 Ultrasound guided injection is preferred based studies that show increased duration, increased effect, greater accuracy, decreased procedural pain, increased response rate, and decreased cost with ultrasound guided versus blind injection.  Verbal informed consent obtained.  Time-out conducted.  Noted no overlying erythema, induration, or other signs of local infection.  Skin prepped in a sterile fashion.  Local anesthesia: Topical Ethyl chloride.  With sterile technique and under real time ultrasound guidance: With a 25-gauge half inch needle injected with 0.2 cc of 0.5% [place] and then injected with 2 cc of PRP Completed without difficulty  Pain immediately resolved suggesting accurate placement of the medication.  Advised to call if fevers/chills, erythema, induration, drainage, or persistent bleeding.  Impression: Technically successful ultrasound guided injection.  Procedure: Real-time Ultrasound Guided Injection of left CMC joint Device: GE Logiq Q7 Ultrasound guided injection is preferred based studies that show increased duration, increased effect, greater accuracy, decreased procedural pain, increased response rate, and decreased cost with ultrasound guided versus blind injection.  Verbal informed consent obtained.  Time-out conducted.  Noted no overlying erythema, induration, or other signs of local infection.  Skin prepped in a sterile fashion.  Local anesthesia: Topical Ethyl chloride.  With sterile technique and under real time ultrasound  guidance: With a 25-gauge half inch needle injected with 0.2 cc of 0.5% Marcaine and then injected with 2 cc of PRP Completed without difficulty  Pain immediately resolved suggesting accurate placement of the medication.  Advised to call if fevers/chills, erythema, induration, drainage, or persistent bleeding.  Impression: Technically successful ultrasound guided injection.    Impression and Recommendations:     The above documentation has been reviewed and is accurate and complete Lyndal Pulley, DO

## 2022-12-20 ENCOUNTER — Encounter: Payer: Self-pay | Admitting: Family Medicine

## 2022-12-20 ENCOUNTER — Ambulatory Visit (INDEPENDENT_AMBULATORY_CARE_PROVIDER_SITE_OTHER): Payer: Self-pay | Admitting: Family Medicine

## 2022-12-20 ENCOUNTER — Ambulatory Visit: Payer: Self-pay

## 2022-12-20 VITALS — BP 120/74 | Ht 69.0 in | Wt 167.0 lb

## 2022-12-20 DIAGNOSIS — M79645 Pain in left finger(s): Secondary | ICD-10-CM

## 2022-12-20 DIAGNOSIS — M180 Bilateral primary osteoarthritis of first carpometacarpal joints: Secondary | ICD-10-CM

## 2022-12-20 DIAGNOSIS — M79644 Pain in right finger(s): Secondary | ICD-10-CM

## 2022-12-20 NOTE — Patient Instructions (Signed)
No ice or IBU for 3 days Heat and Tylenol are ok See me again in 6 weeks 

## 2022-12-20 NOTE — Assessment & Plan Note (Signed)
Post PRP instructions given, discussed potentially bracing at night to help with some of the discomfort and pain as well.  Follow-up with me again in 6 to

## 2023-01-30 NOTE — Progress Notes (Unsigned)
[name] [place] Phone: [phone] Subjective:   Nancy Camacho, am serving as a scribe for Dr. Hulan Saas.  I'm seeing this patient by the request  of:  Reynold Bowen, MD  CC: hand pain follow-up  [id number]  12/20/2022 Post PRP instructions given, discussed potentially bracing at night to help with some of the discomfort and pain as well. Follow-up with me again in 6 to   Update 01/31/2023 Nancy Camacho is a 62 y.o. female coming in with complaint of B thumb pain. Patient states that this go around with the PR did not help at all. States that her thumbs almost feel worse. She is having trouble holding things at work and drops stuff all the time.        Past Medical History:  Diagnosis Date   Anxiety    Chronic back pain    Depression    Diabetes mellitus without complication ([facility])    DKA (diabetic ketoacidosis) ([facility])    History of Bell's palsy    History of COVID-19    History of seborrhea    ear   Thyroid disease    Past Surgical History:  Procedure Laterality Date   ABDOMINOPLASTY  07/2005   BREAST ENHANCEMENT SURGERY  04/2006   COLONOSCOPY  03/15/2014   POLYPECTOMY     TUBAL LIGATION     Social History   Socioeconomic History   Marital status: Married    Spouse name: Not on file   Number of children: Not on file   Years of education: Not on file   Highest education level: Not on file  Occupational History   Not on file  Tobacco Use   Smoking status: Never   Smokeless tobacco: Never  Vaping Use   Vaping Use: Never used  Substance and Sexual Activity   Alcohol use: No   Drug use: No   Sexual activity: Yes    Birth control/protection: None  Other Topics Concern   Not on file  Social History Narrative   Not on file   Social Determinants of Health   Financial Resource Strain: Not on file  Food Insecurity: Not on file  Transportation Needs: Not on file  Physical Activity:  Not on file  Stress: Not on file  Social Connections: Not on file   Allergies  Allergen Reactions   Flexeril [Cyclobenzaprine] Other (See Comments)    "generic flexeril", r/t bell's palsy   Family History  Problem Relation Age of Onset   Diabetes Father    Atrial fibrillation Mother    Colon cancer Neg Hx    Pancreatic cancer Neg Hx    Rectal cancer Neg Hx    Stomach cancer Neg Hx    Colon polyps Neg Hx    Esophageal cancer Neg Hx     Current Outpatient Medications (Endocrine & Metabolic):    estradiol (ESTRACE) 0.5 MG tablet, Take 0.5 mg by mouth daily.   insulin lispro (HUMALOG) 100 UNIT/ML KwikPen, Inject 2-3 Units into the skin 3 (three) times daily with meals.   levothyroxine (SYNTHROID) 75 MCG tablet, Take 75 mcg by mouth every morning.   Semaglutide, 1 MG/DOSE, (OZEMPIC, 1 MG/DOSE,) 4 MG/3ML SOPN, Inject 1 mg into the skin every 7 (seven) days.    Current Outpatient Medications (Analgesics):    acetaminophen (TYLENOL) 500 MG tablet, Take 1,000 mg by mouth every 6 (six) hours as needed for headache (pain).   celecoxib (CELEBREX) 200  MG capsule, Take 1 capsule (200 mg total) by mouth 2 (two) times daily.   Current Outpatient Medications (Other):    ADDERALL XR 20 MG 24 hr capsule, Take 20 mg by mouth every morning.   busPIRone (BUSPAR) 15 MG tablet, Take 15 mg by mouth every morning.   gabapentin (NEURONTIN) 100 MG capsule, Take 2 capsules (200 mg total) by mouth at bedtime.   MAGNESIUM-ZINC PO, Take 1 tablet by mouth at bedtime.   NON FORMULARY, Take 1 tablet by mouth daily at 12 noon. Tart cherry extract 1200 mg   ondansetron (ZOFRAN-ODT) 8 MG disintegrating tablet, Take 4-8 mg by mouth 2 (two) times daily.   Red Yeast Rice Extract (RED YEAST RICE PO), Take 1,200 mg by mouth daily in the afternoon.   Vitamin D, Ergocalciferol, (DRISDOL) 1.25 MG (50000 UNIT) CAPS capsule, Take 50,000 Units by mouth every Tuesday.   vortioxetine HBr (TRINTELLIX) 20 MG TABS tablet, Take  20 mg by mouth every morning.   Reviewed prior external information including notes and imaging from  primary care provider As well as notes that were available from care everywhere and other healthcare systems.  Past medical history, social, surgical and family history all reviewed in electronic medical record.  No pertanent information unless stated regarding to the chief complaint.   Review of Systems:  No headache, visual changes, nausea, vomiting, diarrhea, constipation, dizziness, abdominal pain, skin rash, fevers, chills, night sweats, weight loss, swollen lymph nodes, body aches, joint swelling, chest pain, shortness of breath, mood changes. POSITIVE muscle aches  Objective  Blood pressure 130/60, pulse 90, height '5\' 9"'$  (1.753 m), last menstrual period 09/17/2011, SpO2 99 %.   General: No apparent distress alert and oriented x3 mood and affect normal, dressed appropriately.  HEENT: Pupils equal, extraocular movements intact  Respiratory: Patient's speak in full sentences and does not appear short of breath  Cardiovascular: No lower extremity edema, non tender, no erythema  Patient is improved maybe even a little bit in the thenar eminence bilaterally.  Patient does still severely tender to palpation over the [facility] joints themselves.  Negative Tinel's sign noted.  Neck exam does have some mild limited range of motion but overall nothing severe.  Limited muscular skeletal ultrasound was performed and interpreted by Hulan Saas, M  Limited ultrasound does not show any significant hypoechoic changes that is concerning at the moment.  Spurring noted as well. Impressoin: Bone spurring of the CMC joints bilaterally otherwise some improvement since PRP.    Impression and Recommendations:     The above documentation has been reviewed and is accurate and complete Lyndal Pulley, DO

## 2023-01-31 ENCOUNTER — Ambulatory Visit: Payer: 59 | Admitting: Family Medicine

## 2023-01-31 ENCOUNTER — Encounter: Payer: Self-pay | Admitting: Family Medicine

## 2023-01-31 ENCOUNTER — Ambulatory Visit: Payer: Self-pay

## 2023-01-31 VITALS — BP 130/60 | HR 90 | Ht 69.0 in

## 2023-01-31 DIAGNOSIS — M180 Bilateral primary osteoarthritis of first carpometacarpal joints: Secondary | ICD-10-CM | POA: Diagnosis not present

## 2023-01-31 DIAGNOSIS — M79644 Pain in right finger(s): Secondary | ICD-10-CM | POA: Diagnosis not present

## 2023-01-31 DIAGNOSIS — M79645 Pain in left finger(s): Secondary | ICD-10-CM | POA: Diagnosis not present

## 2023-01-31 MED ORDER — GABAPENTIN 100 MG PO CAPS: 200.0000 mg | ORAL_CAPSULE | Freq: Every evening | ORAL | 0 refills | Status: DC

## 2023-01-31 NOTE — Patient Instructions (Signed)
Good to see you Gabapentin '200mg'$  nightly Send me a message in two weeks to let me know how the gabapentin is working Schedule shockwave up front on your way out Follow up with me in 8 weeks

## 2023-01-31 NOTE — Assessment & Plan Note (Signed)
Patient does have arthritic changes noted of the [facility] joint bilaterally but seems to have more of the spurring that is getting patient more of the trouble at the moment.  Patient will also continue to wear the braces at the moment.  Discussed gabapentin in case there is some areas modulation activity potentially beneficial for some pain.  Could consider nerve conduction study or even ABIs of the upper extremities if this continues to give her any difficulties to rule out anything else.  Have done laboratory workup already at this time.  Follow-up again in 8 weeks

## 2023-02-27 NOTE — Progress Notes (Unsigned)
   Shirlyn Goltz, PhD, LAT, ATC acting as a scribe for Lynne Leader, MD. attest   Tyeshia SRAH CAYTON is a 62 y.o. female who presents to [place] [facility] today for bilateral thumb pain and consultation for shockwave treatment.  Patient was previously seen by Dr. Tamala Julian on 01/31/2023 and on 12/20/2022 had bilateral CMC joint PRP injections.  Today, patient reports***.  Patient locates pain to***  Grip strength: Aggravates: Treatments tried: PRP, tart cherry, Celebrex  Dx imaging: 08/02/2022 [facility] hand x-ray    Pertinent review of systems: ***  Relevant historical information: ***   Exam:  LMP 09/17/2011  General: Well Developed, well nourished, and in no acute distress.   MSK: ***    Lab and Radiology Results No results found for this or any previous visit (from the past 72 hour(s)). No results found.     Assessment and Plan: 62 y.o. female with ***   PDMP not reviewed this encounter. No orders of the defined types were placed in this encounter.  No orders of the defined types were placed in this encounter.    Discussed warning signs or symptoms. Please see discharge instructions. Patient expresses understanding.   ***

## 2023-02-28 ENCOUNTER — Ambulatory Visit: Payer: 59 | Admitting: Family Medicine

## 2023-02-28 VITALS — BP 128/82 | HR 95 | Ht 69.0 in | Wt 168.0 lb

## 2023-02-28 DIAGNOSIS — M180 Bilateral primary osteoarthritis of first carpometacarpal joints: Secondary | ICD-10-CM

## 2023-02-28 NOTE — Patient Instructions (Addendum)
Thank you for coming in today.   Return in 1 week.   Please use Voltaren gel (Generic Diclofenac Gel) up to 4x daily for pain as needed.  This is available over-the-counter as both the name brand Voltaren gel and the generic diclofenac gel.   Consider OT with Nate  Try heat even a paraffin wax hand emersion.

## 2023-03-08 ENCOUNTER — Ambulatory Visit: Payer: Self-pay | Admitting: Family Medicine

## 2023-03-08 DIAGNOSIS — G8929 Other chronic pain: Secondary | ICD-10-CM

## 2023-03-08 DIAGNOSIS — M79644 Pain in right finger(s): Secondary | ICD-10-CM

## 2023-03-08 DIAGNOSIS — M180 Bilateral primary osteoarthritis of first carpometacarpal joints: Secondary | ICD-10-CM

## 2023-03-08 DIAGNOSIS — M25512 Pain in left shoulder: Secondary | ICD-10-CM

## 2023-03-08 DIAGNOSIS — M79645 Pain in left finger(s): Secondary | ICD-10-CM

## 2023-03-08 NOTE — Patient Instructions (Signed)
Thank you for coming in today.   Proceed to OT.   OK to continue shockwave I would like see some improvement after 2 session.

## 2023-03-08 NOTE — Progress Notes (Signed)
   [facility] [address] [place] [address] Phone: [phone]   Extracorporeal Shockwave Therapy Note    Patient is being treated today with ECSWT. Informed consent was obtained and patient tolerated procedure well.   Therapy performed by Clementeen Graham  Condition treated: Bilateral thumb pain first CMC spurring seen on ultrasound Treatment preset used: First CMC [employer] used: 90 mJ Frequency used: 10 Hz Number of pulses: 3000 (1500 each side) Head size: Medium Treatment #2 of #4  Electronically signed by:  [facility] 12:01 PM 03/08/23  Also refer to OT

## 2023-03-14 ENCOUNTER — Other Ambulatory Visit: Payer: Self-pay

## 2023-03-14 ENCOUNTER — Telehealth: Payer: Self-pay

## 2023-03-14 MED ORDER — GABAPENTIN 100 MG PO CAPS: 200.0000 mg | ORAL_CAPSULE | Freq: Every evening | ORAL | 0 refills | Status: DC

## 2023-03-14 NOTE — Telephone Encounter (Signed)
Patient would like to get a refill on the gabapentin to [facility].  FYI Patient also wanted to cancel the 3rd shockwave because her hands were so painful for about 2 weeks and she just started to feel a little better. She would like to not proceed with this treatment. That appointment has been cancelled.

## 2023-03-15 ENCOUNTER — Ambulatory Visit: Payer: 59 | Admitting: Family Medicine

## 2023-03-24 NOTE — Therapy (Incomplete)
OUTPATIENT OCCUPATIONAL THERAPY ORTHO EVALUATION  Patient Name: Nancy Camacho MRN: [id number] DOB:08-16-1961, 62 y.o., female Today's Date: 03/25/2023  PCP: Adrian Prince, MD REFERRING PROVIDER: Rodolph Bong, MD   END OF SESSION:  OT End of Session - 03/25/23 0759     Visit Number 1    Date for OT Re-Evaluation 05/03/23    Authorization Type [employer]    OT Start Time 0800    OT Stop Time 0905    OT Time Calculation (min) 65 min    Equipment Utilized During Treatment [id number]    Activity Tolerance Patient tolerated treatment well;Patient limited by pain;Patient limited by fatigue    Behavior During Therapy [facility] for tasks assessed/performed             Past Medical History:  Diagnosis Date   Anxiety    Chronic back pain    Depression    Diabetes mellitus without complication ([facility])    DKA (diabetic ketoacidosis) ([facility])    History of Bell's palsy    History of COVID-19    History of seborrhea    ear   Thyroid disease    Past Surgical History:  Procedure Laterality Date   ABDOMINOPLASTY  07/2005   BREAST ENHANCEMENT SURGERY  04/2006   COLONOSCOPY  03/15/2014   POLYPECTOMY     TUBAL LIGATION     Patient Active Problem List   Diagnosis Date Noted   Arthritis of carpometacarpal (CMC) joint of both thumbs 05/25/2022   Dyspnea on exertion    COVID-19 virus infection 01/16/2021   Elevated troponin 01/16/2021   Sinusitis 01/16/2021   Hypokalemia 01/16/2021   Type 2 diabetes mellitus without complication ([facility]) 05/11/2013   Depression 09/30/2011   Menopause 09/30/2011   Vitamin D deficiency 09/30/2011   Back pain 09/30/2011    ONSET DATE: ~2 years  REFERRING DIAG:  M79.644,M79.645 (ICD-10-CM) - Bilateral thumb pain  M18.0 (ICD-10-CM) - Arthritis of carpometacarpal (CMC) joint of both thumbs  M25.512,G89.29 (ICD-10-CM) - Chronic left shoulder pain    THERAPY DIAG:  Muscle weakness (generalized)  Other lack of coordination  Pain in joint of left hand  Pain in  joint of right hand  Chronic left shoulder pain  Stiffness of left shoulder, not elsewhere classified  Rationale for Evaluation and Treatment: Rehabilitation  SUBJECTIVE:   SUBJECTIVE STATEMENT: She states both thumbs have been hurting her for ~2 years and now in the past 6 months, Lt shoulder has started to hurt as well- running from neck, down arm to thumb (somewhat like radial nerve distribution). She does wear a glucose monitor on her arm, switches sides. 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 Holding a steering wheel hurts. Lt thumb hurts most, followed by Rt thumb and Lt shoulder. She wears pre-fab thumb spica braces at night.   PERTINENT HISTORY: She has been doing ECSWT with Dr. Denyse Amass.   PRECAUTIONS: None; WEIGHT BEARING RESTRICTIONS: No  PAIN:  Are you having pain? Yes: NPRS scale: 4/10 a rest and up to 8/10 Pain location: b/l thumbs  Pain description: aching in CMC Js, pinching/tight in Lt shoulder  Aggravating factors: motion/working Relieving factors: n/a   FALLS: Has patient fallen in last 6 months? No  LIVING ENVIRONMENT: Lives with: lives with their spouse Has following equipment at home: None  PLOF: Independent  PATIENT GOALS: To have less pain in the left shoulder and hands/thumbs to continue to work and do her job well   OBJECTIVE: (  All objective assessments below are from initial evaluation on: 03/25/23 unless otherwise specified.)   HAND DOMINANCE: Right   ADLs: Overall ADLs: States decreased ability to grab, hold household objects, pain and inability to open containers, perform FMS tasks (manipulate fasteners on clothing), mild to moderate bathing problems as well.    FUNCTIONAL OUTCOME MEASURES: Eval: Quck DASH 54.5% impairment today  (Higher % Score  =  More Impairment)    UPPER EXTREMITY ROM     Shoulder to Wrist AROM Right eval Left eval  Shoulder flexion  106  Shoulder abduction  90   Shoulder extension  51  Shoulder internal rotation  42  Shoulder external rotation  61  Wrist flexion 68 67  Wrist extension 64 69  (Blank rows = not tested)   Hand AROM Right eval Left eval  Full Fist Ability (or Gap to Distal Palmar Crease) full full  Thumb Opposition  (Kapandji Scale)  10 10  Thumb MCP (0-60) 59 58  Thumb IP (0-80) 61 44  Thumb Radial Abduction Span 6.2cm  6.5 cm  (Blank rows = not tested)   UPPER EXTREMITY MMT:     MMT Left 03/25/23  Shoulder flexion   Shoulder abduction   Shoulder adduction   Shoulder extension   Shoulder internal rotation 4+/5  Shoulder external rotation 4/5 tender  (Blank rows = not tested)  HAND FUNCTION: Eval: Observed weakness in affected hand.  Grip strength Right: 38 lbs pain, Left: 48 lbs pain  COORDINATION: Eval: Observed coordination impairments with affected hand. 9 Hole Peg Test Right: 27sec, Left: 27.6 sec   SENSATION: Eval: Light touch intact today, suspicion for radial nerve irritation though  OBSERVATIONS:   Eval: noted muscle spasms in lateral Lt shoulder, also infraspinatus and upper traps, tender to touch. Also TTP to b/l thumb CMC Js.   Presents like tight Lt sh external rotator, b/l thumb CMC J OA    TODAY'S TREATMENT:  Post-evaluation treatment: She is given comprehensive education on causes of arthritis and things to prevent/decrease to help manage symptoms of irritation.  She was also briefly educated on sleeping postures and habits that cause external rotator tightness in the shoulder.  She was given the following home exercise program to perform and it was quickly gone over today at evaluation.  Additionally she was recommended to use some kind of support for the thumb arthritis and OT performs manual therapy KT taping to her left thumb which she feels supportive.  This is considered a light support but she was also explained options for moderate (elastic gloves) and heavy (orthotics) support as well  which we will use as needed.  Exercises - Seated Scapular Retraction  - 4-6 x daily - 5-10 reps - 3-5 hold - Sleeper Stretch  - 3-4 x daily - 3 reps - 15 hold - Standing neck/upper traps stretch  - 3 x daily - 3 reps - 15 sec hold - Seated Wrist Flexion Stretch  - 3 x daily - 3 reps - 15 hold - Wrist Prayer Stretch  - 3 x daily - 3 reps - 15 sec hold - Stretch Thumb DOWNWARD  - 3 x daily - 3 reps - 15 sec hold - Thumb Webspace Stretch  - 3 x daily - 3 reps - 15 sec hold - Towel Roll Grip with Forearm in Neutral  - 3 x daily - 5 reps - 10 sec hold - Spread Index Finger Apart  - 3 x daily - 5 reps -  5 sec hold - C-Strength (try using rubber band)   - 3 x daily - 5 reps - 5 sec hold   PATIENT EDUCATION: Education details: See tx section above for details  Person educated: Patient Education method: Verbal Instruction, Teach back, Handouts  Education comprehension: States and demonstrates understanding, Additional Education required    HOME EXERCISE PROGRAM: See tx section above for details    GOALS: Goals reviewed with patient? Yes   SHORT TERM GOALS: (STG required if POC>30 days) Target Date: 04/12/23  Pt will obtain protective, custom orthotic. Goal status: TBD/PRN  2.  Pt will demo/state understanding of initial HEP to improve pain levels and prerequisite motion. Goal status: INITIAL   LONG TERM GOALS: Target Date: 05/10/23  Pt will improve functional ability by decreased impairment per Quick DASH assessment from 54.5% to 20% or better, for better quality of life. Goal status: INITIAL  2.  Pt will improve grip strength in Rt hand from 38lbs to at least 50lbs for functional use at home and in IADLs. Goal status: INITIAL  3.  Pt will improve A/ROM in Lt sh IR from 42 to at least 55, to have functional motion for tasks like reach and grasp.  Goal status: INITIAL  4.  Pt will improve strength in Lt sh ER from 4/5 tender MMT to at least 4+/5 MMT to have increased  functional ability to carry out selfcare and higher-level homecare tasks with no difficulty. Goal status: INITIAL  5.  Pt will improve coordination skills in b/l hands FMS, as seen by better score on 9HPT testing to 23sec or better, to have increased functional ability to carry out fine motor tasks (fasteners, etc.) and more complex, coordinated IADLs (meal prep, sports, etc.).  Goal status: INITIAL  6.  Pt will decrease pain at worst from 8/10 to 4/10 or better to have better sleep and occupational participation in daily roles. Goal status: INITIAL   ASSESSMENT:  CLINICAL IMPRESSION: Patient is a 62 y.o. female who was seen today for occupational therapy evaluation for bilateral hand and thumb pain as well as left shoulder pain which are decreasing her work ability and ability with functional activities at home as well.  She will benefit from outpatient occupational therapy to decrease symptoms and increase quality of life.   PERFORMANCE DEFICITS: in functional skills including ADLs, IADLs, coordination, dexterity, ROM, strength, pain, fascial restrictions, muscle spasms, flexibility, Fine motor control, Gross motor control, body mechanics, endurance, decreased knowledge of precautions, and UE functional use, cognitive skills including problem solving and safety awareness, and psychosocial skills including coping strategies, environmental adaptation, habits, and routines and behaviors.   IMPAIRMENTS: are limiting patient from ADLs, IADLs, rest and sleep, work, and leisure.   COMORBIDITIES: may have co-morbidities  that affects occupational performance. Patient will benefit from skilled OT to address above impairments and improve overall function.  MODIFICATION OR ASSISTANCE TO COMPLETE EVALUATION: No modification of tasks or assist necessary to complete an evaluation.  OT OCCUPATIONAL PROFILE AND HISTORY: Problem focused assessment: Including review of records relating to presenting  problem.  CLINICAL DECISION MAKING: Moderate - several treatment options, min-mod task modification necessary  REHAB POTENTIAL: Excellent  EVALUATION COMPLEXITY: Low      PLAN:  OT FREQUENCY: 1x/week  OT DURATION: 6 weeks (through 05/03/23)   PLANNED INTERVENTIONS: self care/ADL training, therapeutic exercise, therapeutic activity, neuromuscular re-education, manual therapy, scar mobilization, gait training, splinting, electrical stimulation, ultrasound, compression bandaging, moist heat, cryotherapy, contrast bath, patient/family education, cognitive remediation/compensation,  energy conservation, coping strategies training, DME and/or AE instructions, and Dry needling  RECOMMENDED OTHER SERVICES: none now   CONSULTED AND AGREED WITH PLAN OF CARE: Patient  PLAN FOR NEXT SESSION: Review use of K tape, other support as needed.  Review entire home exercise program for shoulder and thumbs, consider manual therapy dry needling for left shoulder or other manual therapy techniques.   Fannie Knee, OTR/L, CHT 03/25/2023, 9:18 AM

## 2023-03-25 ENCOUNTER — Other Ambulatory Visit: Payer: Self-pay

## 2023-03-25 ENCOUNTER — Ambulatory Visit: Payer: 59 | Admitting: Rehabilitative and Restorative Service Providers"

## 2023-03-25 ENCOUNTER — Encounter: Payer: Self-pay | Admitting: Rehabilitative and Restorative Service Providers"

## 2023-03-25 DIAGNOSIS — R278 Other lack of coordination: Secondary | ICD-10-CM

## 2023-03-25 DIAGNOSIS — M25541 Pain in joints of right hand: Secondary | ICD-10-CM | POA: Diagnosis not present

## 2023-03-25 DIAGNOSIS — M25612 Stiffness of left shoulder, not elsewhere classified: Secondary | ICD-10-CM

## 2023-03-25 DIAGNOSIS — M25542 Pain in joints of left hand: Secondary | ICD-10-CM | POA: Diagnosis not present

## 2023-03-25 DIAGNOSIS — M6281 Muscle weakness (generalized): Secondary | ICD-10-CM

## 2023-03-25 DIAGNOSIS — M25512 Pain in left shoulder: Secondary | ICD-10-CM

## 2023-03-25 DIAGNOSIS — G8929 Other chronic pain: Secondary | ICD-10-CM

## 2023-03-28 NOTE — Progress Notes (Signed)
[facility] [address] [place] [address] Phone: [phone] Subjective:   [name], am serving as a scribe for Dr. Antoine Primas.  I'm seeing this patient by the request  of:  Adrian Prince, MD  CC: Bilateral thumb pain  ION:[id number]  01/31/2023 Patient does have arthritic changes noted of the [facility] joint bilaterally but seems to have more of the spurring that is getting patient more of the trouble at the moment.  Patient will also continue to wear the braces at the moment.  Discussed gabapentin in case there is some areas modulation activity potentially beneficial for some pain.  Could consider nerve conduction study or even ABIs of the upper extremities if this continues to give her any difficulties to rule out anything else.  Have done laboratory workup already at this time.  Follow-up again in 8 weeks      Update 03/29/2023 Nancy Camacho is a 62 y.o. female coming in with complaint of B thumb and L shoulder pain. Has been seeing Dr. Denyse Amass for shockwave. Patient states that she did shockwave therapy 2x. No change in pain. Has been doing PT and is now using KT tape on her thumbs which is helping.   Continued pain in L side of her neck down to the thumb. Gabapentin is helping as she did not take it last night and today is more achy. Using 100mg  QHS.       Past Medical History:  Diagnosis Date   Anxiety    Chronic back pain    Depression    Diabetes mellitus without complication (HCC)    DKA (diabetic ketoacidosis) (HCC)    History of Bell's palsy    History of COVID-19    History of seborrhea    ear   Thyroid disease    Past Surgical History:  Procedure Laterality Date   ABDOMINOPLASTY  07/2005   BREAST ENHANCEMENT SURGERY  04/2006   COLONOSCOPY  03/15/2014   POLYPECTOMY     TUBAL LIGATION     Social History   Socioeconomic History   Marital status: Married    Spouse name: Not on file   Number of children: Not on file    Years of education: Not on file   Highest education level: Not on file  Occupational History   Not on file  Tobacco Use   Smoking status: Never   Smokeless tobacco: Never  Vaping Use   Vaping Use: Never used  Substance and Sexual Activity   Alcohol use: No   Drug use: No   Sexual activity: Yes    Birth control/protection: None  Other Topics Concern   Not on file  Social History Narrative   Not on file   Social Determinants of Health   Financial Resource Strain: Not on file  Food Insecurity: Not on file  Transportation Needs: Not on file  Physical Activity: Not on file  Stress: Not on file  Social Connections: Not on file   Allergies  Allergen Reactions   Flexeril [Cyclobenzaprine] Other (See Comments)    "generic flexeril", r/t bell's palsy   Family History  Problem Relation Age of Onset   Diabetes Father    Atrial fibrillation Mother    Colon cancer Neg Hx    Pancreatic cancer Neg Hx    Rectal cancer Neg Hx    Stomach cancer Neg Hx    Colon polyps Neg Hx    Esophageal cancer Neg Hx  Current Outpatient Medications (Endocrine & Metabolic):    estradiol (ESTRACE) 0.5 MG tablet, Take 0.5 mg by mouth daily.   levothyroxine (SYNTHROID) 75 MCG tablet, Take 75 mcg by mouth every morning.   Semaglutide, 1 MG/DOSE, (OZEMPIC, 1 MG/DOSE,) 4 MG/3ML SOPN, Inject 1 mg into the skin every 7 (seven) days.    Current Outpatient Medications (Analgesics):    acetaminophen (TYLENOL) 500 MG tablet, Take 1,000 mg by mouth every 6 (six) hours as needed for headache (pain).   celecoxib (CELEBREX) 200 MG capsule, Take 1 capsule (200 mg total) by mouth 2 (two) times daily.   Current Outpatient Medications (Other):    ADDERALL XR 20 MG 24 hr capsule, Take 20 mg by mouth every morning.   DULoxetine (CYMBALTA) 20 MG capsule, Take 1 capsule (20 mg total) by mouth daily.   gabapentin (NEURONTIN) 100 MG capsule, Take 2 capsules (200 mg total) by mouth at bedtime.   MAGNESIUM-ZINC PO,  Take 1 tablet by mouth at bedtime.   NON FORMULARY, Take 1 tablet by mouth daily at 12 noon. Tart cherry extract 1200 mg   ondansetron (ZOFRAN-ODT) 8 MG disintegrating tablet, Take 4-8 mg by mouth 2 (two) times daily.   Vitamin D, Ergocalciferol, (DRISDOL) 1.25 MG (50000 UNIT) CAPS capsule, Take 50,000 Units by mouth every Tuesday.   vortioxetine HBr (TRINTELLIX) 20 MG TABS tablet, Take 20 mg by mouth every morning.   Reviewed prior external information including notes and imaging from  primary care provider As well as notes that were available from care everywhere and other healthcare systems.  Past medical history, social, surgical and family history all reviewed in electronic medical record.  No pertanent information unless stated regarding to the chief complaint.   Review of Systems:  No headache, visual changes, nausea, vomiting, diarrhea, constipation, dizziness, abdominal pain, skin rash, fevers, chills, night sweats, weight loss, swollen lymph nodes, body aches, joint swelling, chest pain, shortness of breath, mood changes. POSITIVE muscle aches  Objective  Blood pressure 122/72, pulse 87, height 5\' 9"  (1.753 m), weight 168 lb (76.2 kg), last menstrual period 09/17/2011, SpO2 97 %.   General: No apparent distress alert and oriented x3 mood and affect normal, dressed appropriately.  HEENT: Pupils equal, extraocular movements intact  Respiratory: Patient's speak in full sentences and does not appear short of breath  Cardiovascular: No lower extremity edema, non tender, no erythema  Bilateral thumbs still have some positive grind test noted.  No more swelling on the left greater than the right.  No thenar eminence wasting noted.  Limited muscular skeletal ultrasound was performed and interpreted by Antoine Primas, M  Limited ultrasound of patient's thumbs bilaterally show that patient actually does have significant decrease in the calcific changes noted of the right thumb but  unfortunately minimal to no changes of the left [facility] joint patient does have hypoechoic changes noted of both joints. Impression: Arthritis of the thumbs bilaterally    Impression and Recommendations:    The above documentation has been reviewed and is accurate and complete Nancy Saa, DO

## 2023-03-29 ENCOUNTER — Encounter: Payer: Self-pay | Admitting: Family Medicine

## 2023-03-29 ENCOUNTER — Ambulatory Visit (INDEPENDENT_AMBULATORY_CARE_PROVIDER_SITE_OTHER): Payer: 59 | Admitting: Family Medicine

## 2023-03-29 ENCOUNTER — Other Ambulatory Visit: Payer: Self-pay

## 2023-03-29 VITALS — BP 122/72 | HR 87 | Ht 69.0 in | Wt 168.0 lb

## 2023-03-29 DIAGNOSIS — M180 Bilateral primary osteoarthritis of first carpometacarpal joints: Secondary | ICD-10-CM | POA: Diagnosis not present

## 2023-03-29 DIAGNOSIS — M79645 Pain in left finger(s): Secondary | ICD-10-CM

## 2023-03-29 DIAGNOSIS — M79644 Pain in right finger(s): Secondary | ICD-10-CM | POA: Diagnosis not present

## 2023-03-29 MED ORDER — DULOXETINE HCL 20 MG PO CPEP: 20.0000 mg | ORAL_CAPSULE | Freq: Every day | ORAL | 0 refills | Status: DC

## 2023-03-29 NOTE — Patient Instructions (Addendum)
Cymbalta 20mg  1/2 trimtellix  Send message in 2 weeks, if better will titrate up Can continue OT and KT taping Can consider steroid injections or other PRP See me again in 2 months

## 2023-03-29 NOTE — Assessment & Plan Note (Signed)
Worsening pain again.  Doing taping and Occupational Therapy. We discussed potential different injections.  Skin patient has elected to try Cymbalta.  Warned of potential side effects and will titrate off other antidepressant medicine.  Hopefully this will help with some of the pain.  Follow-up with me again in 6 to 8 weeks

## 2023-04-01 ENCOUNTER — Encounter: Payer: 59 | Admitting: Rehabilitative and Restorative Service Providers"

## 2023-04-02 NOTE — Therapy (Signed)
OUTPATIENT OCCUPATIONAL THERAPY TREATMENT NOTE  Patient Name: Nancy Camacho MRN: [id number] DOB:21-Jul-1961, 62 y.o., female Today's Date: 04/05/2023  PCP: Adrian Prince, MD REFERRING PROVIDER: Rodolph Bong, MD   END OF SESSION:  OT End of Session - 04/05/23 0851     Visit Number 2    Date for OT Re-Evaluation 05/03/23    Authorization Type [employer]    OT Start Time 0851    OT Stop Time 0934    OT Time Calculation (min) 43 min    Equipment Utilized During Treatment --    Activity Tolerance Patient tolerated treatment well;Patient limited by pain;Patient limited by fatigue;No increased pain    Behavior During Therapy WFL for tasks assessed/performed              Past Medical History:  Diagnosis Date   Anxiety    Chronic back pain    Depression    Diabetes mellitus without complication (HCC)    DKA (diabetic ketoacidosis) (HCC)    History of Bell's palsy    History of COVID-19    History of seborrhea    ear   Thyroid disease    Past Surgical History:  Procedure Laterality Date   ABDOMINOPLASTY  07/2005   BREAST ENHANCEMENT SURGERY  04/2006   COLONOSCOPY  03/15/2014   POLYPECTOMY     TUBAL LIGATION     Patient Active Problem List   Diagnosis Date Noted   Arthritis of carpometacarpal (CMC) joint of both thumbs 05/25/2022   Dyspnea on exertion    COVID-19 virus infection 01/16/2021   Elevated troponin 01/16/2021   Sinusitis 01/16/2021   Hypokalemia 01/16/2021   Type 2 diabetes mellitus without complication (HCC) 05/11/2013   Depression 09/30/2011   Menopause 09/30/2011   Vitamin D deficiency 09/30/2011   Back pain 09/30/2011    ONSET DATE: ~2 years  REFERRING DIAG:  M79.644,M79.645 (ICD-10-CM) - Bilateral thumb pain  M18.0 (ICD-10-CM) - Arthritis of carpometacarpal (CMC) joint of both thumbs  M25.512,G89.29 (ICD-10-CM) - Chronic left shoulder pain    THERAPY DIAG:  Muscle weakness (generalized)  Other lack of coordination  Pain in joint of left  hand  Stiffness of left shoulder, not elsewhere classified  Chronic left shoulder pain  Pain in joint of right hand  Rationale for Evaluation and Treatment: Rehabilitation  PERTINENT HISTORY: She has been doing ECSWT with Dr. Denyse Amass.  She states both thumbs have been hurting her for ~2 years and now in the past 6 months, Lt shoulder has started to hurt as well- running from neck, down arm to thumb (somewhat like radial nerve distribution). She does wear a glucose monitor on her arm, switches sides. 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 Holding a steering wheel hurts. Lt thumb hurts most, followed by Rt thumb and Lt shoulder. She wears pre-fab thumb spica braces at night.   PRECAUTIONS: None; WEIGHT BEARING RESTRICTIONS: No   SUBJECTIVE:   SUBJECTIVE STATEMENT: She states more aware of her habits at work now, trying to stand more often rather than reach up with her shoulders.  She is trying to monitor her postures throughout the day as well and do some intermittent stretching when able.  She is wearing braces that she brought today that are silicone in nature which she finds to be helpful and she can wear while washing her hands.  She states K-tape was working well, but got peeled up too easily and became "nasty" after washing hands  too many times. Her current braces are mildly irritating her somewhat and she was advised to only wear them as needed during stressful activities.  Her neck is pulling and tight especially on the left side and she requested dry needling manual therapy today.   PAIN:  Are you having pain?  Yes: NPRS scale: 5-6/10 Pain location: b/l thumbs  Pain description: aching in CMC Js, pinching/tight in Lt shoulder  Aggravating factors: motion/working Relieving factors: n/a   PATIENT GOALS: To have less pain in the left shoulder and hands/thumbs to continue to work and do her job well   OBJECTIVE: (All objective  assessments below are from initial evaluation on: 03/25/23 unless otherwise specified.)   HAND DOMINANCE: Right   ADLs: Overall ADLs: States decreased ability to grab, hold household objects, pain and inability to open containers, perform FMS tasks (manipulate fasteners on clothing), mild to moderate bathing problems as well.    FUNCTIONAL OUTCOME MEASURES: Eval: Quck DASH 54.5% impairment today  (Higher % Score  =  More Impairment)    UPPER EXTREMITY ROM     Shoulder to Wrist AROM Right eval Left eval  Shoulder flexion  106  Shoulder abduction  90  Shoulder extension  51  Shoulder internal rotation  42  Shoulder external rotation  61  Wrist flexion 68 67  Wrist extension 64 69  (Blank rows = not tested)   Hand AROM Right eval Left eval  Full Fist Ability (or Gap to Distal Palmar Crease) full full  Thumb Opposition  (Kapandji Scale)  10 10  Thumb MCP (0-60) 59 58  Thumb IP (0-80) 61 44  Thumb Radial Abduction Span 6.2cm  6.5 cm  (Blank rows = not tested)   UPPER EXTREMITY MMT:     MMT Left 03/25/23  Shoulder flexion   Shoulder abduction   Shoulder adduction   Shoulder extension   Shoulder internal rotation 4+/5  Shoulder external rotation 4/5 tender  (Blank rows = not tested)  HAND FUNCTION: Eval: Observed weakness in affected hand.  Grip strength Right: 38 lbs pain, Left: 48 lbs pain  COORDINATION: Eval: Observed coordination impairments with affected hand. 9 Hole Peg Test Right: 27sec, Left: 27.6 sec   SENSATION: Eval: Light touch intact today, suspicion for radial nerve irritation though  OBSERVATIONS:   Eval: noted muscle spasms in lateral Lt shoulder, also infraspinatus and upper traps, tender to touch. Also TTP to b/l thumb CMC Js.   Presents like tight Lt sh external rotator, b/l thumb CMC J OA    TODAY'S TREATMENT:  04/05/23: With her permission and consent, OT starts with manual therapy modality dry needling using 2 separate 0.30  x 50mm  needles inserted only partly into several areas of the left upper extremity today including: levator scapula, upper trap, infraspinatus, lateral shoulder, and left tricep area.  She had no significant bruising, bleeding, lingering pain, tingling, etc. when she was done and instead feels looser and like the "muscles released."  OT also uses some myofascial release and trigger point massage techniques around the tricep and forearm as well.  Next, OT reviews her home exercise program with her for shoulder stretches as well as "stable C" program.  The last 3 dynamic thumb stability activities of the stable C program were discussed and performed together today (bolded below).  She has tension doing these but no significant pain.  At the end of the session she states feeling less pain than when she enters feeling looser and will head  off to her job now.  She was reminded to continue to watch her postures at work perform intermittent stretches as possible.  Exercises - Seated Scapular Retraction  - 4-6 x daily - 5-10 reps - 3-5 hold - Sleeper Stretch  - 3-4 x daily - 3 reps - 15 hold - Standing neck/upper traps stretch  - 3 x daily - 3 reps - 15 sec hold - Seated Wrist Flexion Stretch  - 3 x daily - 3 reps - 15 hold - Wrist Prayer Stretch  - 3 x daily - 3 reps - 15 sec hold - Stretch Thumb DOWNWARD  - 3 x daily - 3 reps - 15 sec hold - Thumb Webspace Stretch  - 3 x daily - 3 reps - 15 sec hold - Towel Roll Grip with Forearm in Neutral  - 3 x daily - 5 reps - 10 sec hold - Spread Index Finger Apart  - 3 x daily - 5 reps - 5 sec hold - C-Strength (try using rubber band)   - 3 x daily - 5 reps - 5 sec hold   PATIENT EDUCATION: Education details: See tx section above for details  Person educated: Patient Education method: Verbal Instruction, Teach back, Handouts  Education comprehension: States and demonstrates understanding, Additional Education required    HOME EXERCISE PROGRAM: Access Code:  [email or web address] URL: [email or web address] Date: 04/05/2023 Prepared by: Fannie Knee   GOALS: Goals reviewed with patient? Yes   SHORT TERM GOALS: (STG required if POC>30 days) Target Date: 04/12/23  Pt will obtain protective, custom orthotic. Goal status: TBD/PRN  2.  Pt will demo/state understanding of initial HEP to improve pain levels and prerequisite motion. Goal status: INITIAL   LONG TERM GOALS: Target Date: 05/10/23  Pt will improve functional ability by decreased impairment per Quick DASH assessment from 54.5% to 20% or better, for better quality of life. Goal status: INITIAL  2.  Pt will improve grip strength in Rt hand from 38lbs to at least 50lbs for functional use at home and in IADLs. Goal status: INITIAL  3.  Pt will improve A/ROM in Lt sh IR from 42 to at least 55, to have functional motion for tasks like reach and grasp.  Goal status: INITIAL  4.  Pt will improve strength in Lt sh ER from 4/5 tender MMT to at least 4+/5 MMT to have increased functional ability to carry out selfcare and higher-level homecare tasks with no difficulty. Goal status: INITIAL  5.  Pt will improve coordination skills in b/l hands FMS, as seen by better score on 9HPT testing to 23sec or better, to have increased functional ability to carry out fine motor tasks (fasteners, etc.) and more complex, coordinated IADLs (meal prep, sports, etc.).  Goal status: INITIAL  6.  Pt will decrease pain at worst from 8/10 to 4/10 or better to have better sleep and occupational participation in daily roles. Goal status: INITIAL   ASSESSMENT:  CLINICAL IMPRESSION: 04/05/23: She received great benefit today from manual therapy so that can be continued, and it shows promise that she has changed her habits while at work.  Continue to promote consistent stretches done throughout the day and coping and management of thumb pain.    PLAN:  OT FREQUENCY: 1x/week  OT DURATION: 6 weeks  (through 05/03/23)   PLANNED INTERVENTIONS: self care/ADL training, therapeutic exercise, therapeutic activity, neuromuscular re-education, manual therapy, scar mobilization, gait training, splinting, electrical stimulation, ultrasound, compression bandaging, moist heat, cryotherapy, contrast bath, patient/family  education, cognitive remediation/compensation, energy conservation, coping strategies training, DME and/or AE instructions, and Dry needling  CONSULTED AND AGREED WITH PLAN OF CARE: Patient  PLAN FOR NEXT SESSION:  Consider adding radial nerve glides to her HEP, review HEP as needed, perform manual therapy dry needling again as needed, keep doing stretches etc. to work towards goals  [employer], OTR/L, CHT 04/05/2023, 11:00 AM

## 2023-04-05 ENCOUNTER — Encounter: Payer: Self-pay | Admitting: Rehabilitative and Restorative Service Providers"

## 2023-04-05 ENCOUNTER — Ambulatory Visit: Payer: 59 | Admitting: Rehabilitative and Restorative Service Providers"

## 2023-04-05 DIAGNOSIS — M25512 Pain in left shoulder: Secondary | ICD-10-CM

## 2023-04-05 DIAGNOSIS — M6281 Muscle weakness (generalized): Secondary | ICD-10-CM | POA: Diagnosis not present

## 2023-04-05 DIAGNOSIS — M25542 Pain in joints of left hand: Secondary | ICD-10-CM | POA: Diagnosis not present

## 2023-04-05 DIAGNOSIS — G8929 Other chronic pain: Secondary | ICD-10-CM

## 2023-04-05 DIAGNOSIS — R278 Other lack of coordination: Secondary | ICD-10-CM

## 2023-04-05 DIAGNOSIS — M25612 Stiffness of left shoulder, not elsewhere classified: Secondary | ICD-10-CM

## 2023-04-05 DIAGNOSIS — M25541 Pain in joints of right hand: Secondary | ICD-10-CM

## 2023-04-08 ENCOUNTER — Encounter: Payer: Self-pay | Admitting: Family Medicine

## 2023-04-09 ENCOUNTER — Other Ambulatory Visit: Payer: Self-pay

## 2023-04-09 MED ORDER — DULOXETINE HCL 30 MG PO CPEP: 30.0000 mg | ORAL_CAPSULE | Freq: Every day | ORAL | 0 refills | Status: DC

## 2023-04-10 NOTE — Therapy (Signed)
OUTPATIENT OCCUPATIONAL THERAPY TREATMENT NOTE  Patient Name: Nancy Camacho MRN: [id number] DOB:1961/02/22, 62 y.o., female Today's Date: 04/15/2023  PCP: Adrian Prince, MD REFERRING PROVIDER: Rodolph Bong, MD   END OF SESSION:  Past Medical History:  Diagnosis Date   Anxiety    Chronic back pain    Depression    Diabetes mellitus without complication (HCC)    DKA (diabetic ketoacidosis) (HCC)    History of Bell's palsy    History of COVID-19    History of seborrhea    ear   Thyroid disease    Past Surgical History:  Procedure Laterality Date   ABDOMINOPLASTY  07/2005   BREAST ENHANCEMENT SURGERY  04/2006   COLONOSCOPY  03/15/2014   POLYPECTOMY     TUBAL LIGATION     Patient Active Problem List   Diagnosis Date Noted   Arthritis of carpometacarpal (CMC) joint of both thumbs 05/25/2022   Dyspnea on exertion    COVID-19 virus infection 01/16/2021   Elevated troponin 01/16/2021   Sinusitis 01/16/2021   Hypokalemia 01/16/2021   Type 2 diabetes mellitus without complication (HCC) 05/11/2013   Depression 09/30/2011   Menopause 09/30/2011   Vitamin D deficiency 09/30/2011   Back pain 09/30/2011    ONSET DATE: ~2 years  REFERRING DIAG:  M79.644,M79.645 (ICD-10-CM) - Bilateral thumb pain  M18.0 (ICD-10-CM) - Arthritis of carpometacarpal (CMC) joint of both thumbs  M25.512,G89.29 (ICD-10-CM) - Chronic left shoulder pain    THERAPY DIAG:  Muscle weakness (generalized)  Other lack of coordination  Pain in joint of left hand  Pain in joint of right hand  Chronic left shoulder pain  Stiffness of left shoulder, not elsewhere classified  Rationale for Evaluation and Treatment: Rehabilitation  PERTINENT HISTORY: She has been doing ECSWT with Dr. Denyse Amass.  She states both thumbs have been hurting her for ~2 years and now in the past 6 months, Lt shoulder has started to hurt as well- running from neck, down arm to thumb (somewhat like radial nerve distribution). She  does wear a glucose monitor on her arm, switches sides. 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 Holding a steering wheel hurts. Lt thumb hurts most, followed by Rt thumb and Lt shoulder. She wears pre-fab thumb spica braces at night.   PRECAUTIONS: None; WEIGHT BEARING RESTRICTIONS: No   SUBJECTIVE:   SUBJECTIVE STATEMENT: She states that her thumbs are no longer her biggest issue and she feels more problem from tightness coming from her left shoulder and down her arm.  She has been changing her habits at work as able.  She did get a great benefit from dry needling and would like to continue with that.   PAIN:  Are you having pain?  Yes: NPRS scale: 3/10 Pain location: b/l thumbs  Pain description: aching in CMC Js, pinching/tight in Lt shoulder  Aggravating factors: motion/working Relieving factors: n/a   PATIENT GOALS: To have less pain in the left shoulder and hands/thumbs to continue to work and do her job well   OBJECTIVE: (All objective assessments below are from initial evaluation on: 03/25/23 unless otherwise specified.)   HAND DOMINANCE: Right   ADLs: Overall ADLs: States decreased ability to grab, hold household objects, pain and inability to open containers, perform FMS tasks (manipulate fasteners on clothing), mild to moderate bathing problems as well.    FUNCTIONAL OUTCOME MEASURES: Eval: Quck DASH 54.5% impairment today  (Higher % Score  =  More Impairment)  UPPER EXTREMITY ROM     Shoulder to Wrist AROM Right eval Left eval Lt 04/11/23  Shoulder flexion  106 104 (131* after therapy and DN today)   Shoulder abduction  90 104  Shoulder extension  51 56  Shoulder internal rotation  42 30  Shoulder external rotation  61 44  Wrist flexion 68 67 67  Wrist extension 64 69 74  (Blank rows = not tested)   Hand AROM Right eval Left eval  Full Fist Ability (or Gap to Distal Palmar Crease) full full  Thumb  Opposition  (Kapandji Scale)  10 10  Thumb MCP (0-60) 59 58  Thumb IP (0-80) 61 44  Thumb Radial Abduction Span 6.2cm  6.5 cm  (Blank rows = not tested)   UPPER EXTREMITY MMT:     MMT Left 03/25/23  Shoulder flexion   Shoulder abduction   Shoulder adduction   Shoulder extension   Shoulder internal rotation 4+/5  Shoulder external rotation 4/5 tender  (Blank rows = not tested)  HAND FUNCTION: Eval: Observed weakness in affected hand.  Grip strength Right: 38 lbs pain, Left: 48 lbs pain  COORDINATION: Eval: Observed coordination impairments with affected hand. 9 Hole Peg Test Right: 27sec, Left: 27.6 sec   SENSATION: Eval: Light touch intact today, suspicion for radial nerve irritation though  OBSERVATIONS:   Eval: noted muscle spasms in lateral Lt shoulder, also infraspinatus and upper traps, tender to touch. Also TTP to b/l thumb CMC Js.   Presents like tight Lt sh external rotator, b/l thumb CMC J OA    TODAY'S TREATMENT:  04/11/23: OT has her perform AROM for exercise as well as new measures which shows improvement at the wrist but not on the shoulder yet.  She does admit to not doing many shoulder stretches, so OT supplies with a new shoulder exercise program as she states understanding her "stable C" program.  Each of these are done carefully with her her having no pain and feeling a good stretch.  Next OT does manual therapy dry needling modality with a 0.30 x 40mm needle inserted only partly to tight left upper trap levator Scap and infraspinatus followed by dry needling 0.25 x 30mm needle to bilateral thumb abductors (she states this helps with thumb tension and pain).  This is done with her consent and she states feeling better after no significant pain bleeding or tingling etc.  Her range of motion is rechecked and significantly improves after treatment and dry needling today.  She leaves happy stating understanding to do new exercises.    Exercises - Standing Radial  Nerve Glide  - 4-6 x daily - 1 sets - 10-15 reps - Seated Shoulder Flexion Towel Slide at Table Top  - 4 x daily - 3-5 reps - 15 hold - Seated Shoulder Abduction Towel Slide at Table Top  - 3-4 x daily - 3-5 reps - 15 hold - Seated Shoulder External Rotation PROM on Table  - 3-4 x daily - 3-5 reps - 15 sec hold - Sleeper Stretch  - 3-4 x daily - 5 reps - 15-20 sec hold - Standing neck/upper traps stretch  - 4-6 x daily - 3-5 reps - 15 sec hold - Shoulder External Rotation with Anchored Resistance  - 2-4 x daily - 1 sets - 10-15 reps - 2-3 sec hold - Standing Shoulder Row with Anchored Resistance  - 2-4 x daily - 1-2 sets - 10-15 reps - Seated Wrist Flexion Stretch  - 3  x daily - 3 reps - 15 hold - Wrist Prayer Stretch  - 3 x daily - 3 reps - 15 sec hold - Stretch Thumb DOWNWARD  - 3 x daily - 3 reps - 15 sec hold - Thumb Webspace Stretch  - 3 x daily - 3 reps - 15 sec hold - Towel Roll Grip with Forearm in Neutral  - 3 x daily - 5 reps - 10 sec hold - Spread Index Finger Apart  - 3 x daily - 5 reps - 5 sec hold - C-Strength (try using rubber band)   - 3 x daily - 5 reps - 5 sec hold  PATIENT EDUCATION: Education details: See tx section above for details  Person educated: Patient Education method: Verbal Instruction, Teach back, Handouts  Education comprehension: States and demonstrates understanding, Additional Education required    HOME EXERCISE PROGRAM: Access Code: [email or web address] URL: [email or web address] Date: 04/05/2023 Prepared by: Fannie Knee   GOALS: Goals reviewed with patient? Yes   SHORT TERM GOALS: (STG required if POC>30 days) Target Date: 04/12/23  Pt will obtain protective, custom orthotic. Goal status: TBD/PRN  2.  Pt will demo/state understanding of initial HEP to improve pain levels and prerequisite motion. Goal status: INITIAL   LONG TERM GOALS: Target Date: 05/10/23  Pt will improve functional ability by decreased impairment per Quick DASH  assessment from 54.5% to 20% or better, for better quality of life. Goal status: INITIAL  2.  Pt will improve grip strength in Rt hand from 38lbs to at least 50lbs for functional use at home and in IADLs. Goal status: INITIAL  3.  Pt will improve A/ROM in Lt sh IR from 42 to at least 55, to have functional motion for tasks like reach and grasp.  Goal status: INITIAL  4.  Pt will improve strength in Lt sh ER from 4/5 tender MMT to at least 4+/5 MMT to have increased functional ability to carry out selfcare and higher-level homecare tasks with no difficulty. Goal status: INITIAL  5.  Pt will improve coordination skills in b/l hands FMS, as seen by better score on 9HPT testing to 23sec or better, to have increased functional ability to carry out fine motor tasks (fasteners, etc.) and more complex, coordinated IADLs (meal prep, sports, etc.).  Goal status: INITIAL  6.  Pt will decrease pain at worst from 8/10 to 4/10 or better to have better sleep and occupational participation in daily roles. Goal status: INITIAL   ASSESSMENT:  CLINICAL IMPRESSION: 04/11/23: She reacted very well to shoulder stretches and dry needling about the scapula and shoulder today, increasing her range of motion greatly and having no pain at the end.  Continue on  04/05/23: She received great benefit today from manual therapy so that can be continued, and it shows promise that she has changed her habits while at work.  Continue to promote consistent stretches done throughout the day and coping and management of thumb pain.    PLAN:  OT FREQUENCY: 1x/week  OT DURATION: 6 weeks (through 05/03/23)   PLANNED INTERVENTIONS: self care/ADL training, therapeutic exercise, therapeutic activity, neuromuscular re-education, manual therapy, scar mobilization, gait training, splinting, electrical stimulation, ultrasound, compression bandaging, moist heat, cryotherapy, contrast bath, patient/family education, cognitive  remediation/compensation, energy conservation, coping strategies training, DME and/or AE instructions, and Dry needling  CONSULTED AND AGREED WITH PLAN OF CARE: Patient  PLAN FOR NEXT SESSION:  Continue on managing scapular and shoulder tightness as well as radial nerve issues that contribute  towards bilateral thumb pain and OA as well as weakness.  Fannie Knee, OTR/L, CHT 04/15/2023, 9:13 AM

## 2023-04-11 ENCOUNTER — Encounter: Payer: Self-pay | Admitting: Rehabilitative and Restorative Service Providers"

## 2023-04-11 ENCOUNTER — Ambulatory Visit: Payer: 59 | Admitting: Rehabilitative and Restorative Service Providers"

## 2023-04-11 DIAGNOSIS — G8929 Other chronic pain: Secondary | ICD-10-CM

## 2023-04-11 DIAGNOSIS — M25541 Pain in joints of right hand: Secondary | ICD-10-CM | POA: Diagnosis not present

## 2023-04-11 DIAGNOSIS — M6281 Muscle weakness (generalized): Secondary | ICD-10-CM | POA: Diagnosis not present

## 2023-04-11 DIAGNOSIS — R278 Other lack of coordination: Secondary | ICD-10-CM

## 2023-04-11 DIAGNOSIS — M25542 Pain in joints of left hand: Secondary | ICD-10-CM

## 2023-04-11 DIAGNOSIS — M25512 Pain in left shoulder: Secondary | ICD-10-CM

## 2023-04-11 DIAGNOSIS — M25612 Stiffness of left shoulder, not elsewhere classified: Secondary | ICD-10-CM

## 2023-04-25 NOTE — Therapy (Signed)
OUTPATIENT OCCUPATIONAL THERAPY TREATMENT NOTE  Patient Name: Nancy Camacho MRN: [id number] DOB:1961/10/19, 62 y.o., female Today's Date: 04/26/2023  PCP: Adrian Prince, MD REFERRING PROVIDER: Rodolph Bong, MD   END OF SESSION:  OT End of Session - 04/26/23 0801     Visit Number 4    Number of Visits 7    Date for OT Re-Evaluation 05/03/23    Authorization Type [employer]    OT Start Time 0801    OT Stop Time 0854    OT Time Calculation (min) 53 min    Activity Tolerance Patient tolerated treatment well;Patient limited by pain;Patient limited by fatigue;No increased pain    Behavior During Therapy WFL for tasks assessed/performed            Past Medical History:  Diagnosis Date   Anxiety    Chronic back pain    Depression    Diabetes mellitus without complication (HCC)    DKA (diabetic ketoacidosis) (HCC)    History of Bell's palsy    History of COVID-19    History of seborrhea    ear   Thyroid disease    Past Surgical History:  Procedure Laterality Date   ABDOMINOPLASTY  07/2005   BREAST ENHANCEMENT SURGERY  04/2006   COLONOSCOPY  03/15/2014   POLYPECTOMY     TUBAL LIGATION     Patient Active Problem List   Diagnosis Date Noted   Arthritis of carpometacarpal (CMC) joint of both thumbs 05/25/2022   Dyspnea on exertion    COVID-19 virus infection 01/16/2021   Elevated troponin 01/16/2021   Sinusitis 01/16/2021   Hypokalemia 01/16/2021   Type 2 diabetes mellitus without complication (HCC) 05/11/2013   Depression 09/30/2011   Menopause 09/30/2011   Vitamin D deficiency 09/30/2011   Back pain 09/30/2011    ONSET DATE: ~2 years  REFERRING DIAG:  M79.644,M79.645 (ICD-10-CM) - Bilateral thumb pain  M18.0 (ICD-10-CM) - Arthritis of carpometacarpal (CMC) joint of both thumbs  M25.512,G89.29 (ICD-10-CM) - Chronic left shoulder pain    THERAPY DIAG:  Muscle weakness (generalized)  Pain in joint of left hand  Stiffness of left shoulder, not elsewhere  classified  Other lack of coordination  Chronic left shoulder pain  Pain in joint of right hand  Rationale for Evaluation and Treatment: Rehabilitation  PERTINENT HISTORY: She has been doing ECSWT with Dr. Denyse Amass.  She states both thumbs have been hurting her for ~2 years and now in the past 6 months, Lt shoulder has started to hurt as well- running from neck, down arm to thumb (somewhat like radial nerve distribution). She does wear a glucose monitor on her arm, switches sides. 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 Holding a steering wheel hurts. Lt thumb hurts most, followed by Rt thumb and Lt shoulder. She wears pre-fab thumb spica braces at night.   PRECAUTIONS: None; WEIGHT BEARING RESTRICTIONS: No   SUBJECTIVE:   SUBJECTIVE STATEMENT: She states doing much better, DN helping, doing stretches, finger abd was a bit painful.  She states thumbs get sore at times but not painful like they used to be.  PAIN:  Are you having pain? Yes: NPRS scale: 2/10 Pain location: b/l thumbs  Pain description: aching in CMC Js, pinching/tight in Lt shoulder  Aggravating factors: motion/working Relieving factors: n/a   PATIENT GOALS: To have less pain in the left shoulder and hands/thumbs to continue to work and do her job well   OBJECTIVE: (All objective  assessments below are from initial evaluation on: 03/25/23 unless otherwise specified.)   HAND DOMINANCE: Right   ADLs: Overall ADLs: States decreased ability to grab, hold household objects, pain and inability to open containers, perform FMS tasks (manipulate fasteners on clothing), mild to moderate bathing problems as well.    FUNCTIONAL OUTCOME MEASURES: Eval: Quck DASH 54.5% impairment today  (Higher % Score  =  More Impairment)    UPPER EXTREMITY ROM     Shoulder to Wrist AROM Right eval Left eval Lt 04/11/23 Lt 04/26/23  Shoulder flexion  106 104 (131* after therapy and DN today)   106  Shoulder abduction  90 104 113  Shoulder extension  51 56 41  Shoulder internal rotation  42 30 33  Shoulder external rotation  61 44 50  Wrist flexion 68 67 67 68  Wrist extension 64 69 74 73  (Blank rows = not tested)   Hand AROM Right eval Left eval  Full Fist Ability (or Gap to Distal Palmar Crease) full full  Thumb Opposition  (Kapandji Scale)  10 10  Thumb MCP (0-60) 59 58  Thumb IP (0-80) 61 44  Thumb Radial Abduction Span 6.2cm  6.5 cm  (Blank rows = not tested)   UPPER EXTREMITY MMT:     MMT Left 03/25/23  Shoulder flexion   Shoulder abduction   Shoulder adduction   Shoulder extension   Shoulder internal rotation 4+/5  Shoulder external rotation 4/5 tender  (Blank rows = not tested)  HAND FUNCTION: Eval: Observed weakness in affected hand.  Grip strength Right: 38 lbs pain, Left: 48 lbs pain  COORDINATION: Eval: Observed coordination impairments with affected hand. 9 Hole Peg Test Right: 27sec, Left: 27.6 sec   SENSATION: Eval: Light touch intact today, suspicion for radial nerve irritation though  OBSERVATIONS:   Eval: noted muscle spasms in lateral Lt shoulder, also infraspinatus and upper traps, tender to touch. Also TTP to b/l thumb CMC Js.   Presents like tight Lt sh external rotator, b/l thumb CMC J OA    TODAY'S TREATMENT:  04/26/23: She performs active range of motion for exercise as well as new measures today which shows concerning remaining tightness in the left arm.  OT does not feel that she is doing enough stretches during the day and these were reviewed with her-she needs some reeducation cues for these as she seems mildly forgetful about a few of them.  OT emphasizes to be doing these things at least 2 or 3 times a day as possible.  After having her perform each of the stretches back today for knowledge check, OT also educates on new scapular/shoulder strengthening and external rotation and also standing rows with red therapy band (as  bolded below).  She tolerates these well with some soreness with external rotation.  She request dry needling again today so OT performs manual therapy dry needling techniques to her tight and spasming infraspinatus as well as lateral shoulder areas.  OT uses a 0.30 x 50mm needle inserted only partly into these areas and achieved excellent twitch responses in both places.  The same is done with a 0.25 x 30mm needle to bilateral thumb abductors again which she states continues to be a significant help to her.  She leaves in no significant pain or problems just minor soreness from new strengthening.  Exercises - Standing Radial Nerve Glide  - 4-6 x daily - 1 sets - 10-15 reps - Seated Scapular Retraction  - 4 x daily -  5-10 reps - Doorway Stretches (both arms low, lean in gently)   - 3-4 x daily - 3-5 reps - 15 hold - Seated Shoulder Flexion Towel Slide at Table Top  - 4 x daily - 3-5 reps - 15 hold - Seated Shoulder Abduction Towel Slide at Table Top  - 3-4 x daily - 3-5 reps - 15 hold - Seated Shoulder External Rotation PROM on Table  - 3-4 x daily - 3-5 reps - 15 sec hold - Sleeper Stretch  - 3-4 x daily - 5 reps - 15-20 sec hold - Standing neck/upper traps stretch  - 4-6 x daily - 3-5 reps - 15 sec hold - Shoulder External Rotation with Anchored Resistance  - 2-4 x daily - 1 sets - 10-15 reps - 2-3 sec hold - Standing Shoulder Row with Anchored Resistance  - 2-4 x daily - 1-2 sets - 10-15 reps   PATIENT EDUCATION: Education details: See tx section above for details  Person educated: Patient Education method: Verbal Instruction, Teach back, Handouts  Education comprehension: States and demonstrates understanding, Additional Education required    HOME EXERCISE PROGRAM: Access Code: [email or web address] URL: [email or web address] Date: 04/05/2023 Prepared by: Fannie Knee   GOALS: Goals reviewed with patient? Yes   SHORT TERM GOALS: (STG required if POC>30 days) Target Date:  04/12/23  Pt will obtain protective, custom orthotic. Goal status: TBD/PRN  2.  Pt will demo/state understanding of initial HEP to improve pain levels and prerequisite motion. Goal status: 04/26/23: progressing    LONG TERM GOALS: Target Date: 05/10/23  Pt will improve functional ability by decreased impairment per Quick DASH assessment from 54.5% to 20% or better, for better quality of life. Goal status: INITIAL  2.  Pt will improve grip strength in Rt hand from 38lbs to at least 50lbs for functional use at home and in IADLs. Goal status: INITIAL  3.  Pt will improve A/ROM in Lt sh IR from 42 to at least 55, to have functional motion for tasks like reach and grasp.  Goal status: INITIAL  4.  Pt will improve strength in Lt sh ER from 4/5 tender MMT to at least 4+/5 MMT to have increased functional ability to carry out selfcare and higher-level homecare tasks with no difficulty. Goal status: INITIAL  5.  Pt will improve coordination skills in b/l hands FMS, as seen by better score on 9HPT testing to 23sec or better, to have increased functional ability to carry out fine motor tasks (fasteners, etc.) and more complex, coordinated IADLs (meal prep, sports, etc.).  Goal status: INITIAL  6.  Pt will decrease pain at worst from 8/10 to 4/10 or better to have better sleep and occupational participation in daily roles. Goal status: INITIAL   ASSESSMENT:  CLINICAL IMPRESSION: 04/26/23: She had not been seen in 2 weeks and she seems somewhat forgetful of some of her stretches, so OT emphasizes that these things should be done 2-3 times a day to help loosen up her stiffness.  It is very positive that she seems to be changing some of her habits and having less consistent pain.  04/11/23: She reacted very well to shoulder stretches and dry needling about the scapula and shoulder today, increasing her range of motion greatly and having no pain at the end.  Continue on  04/05/23: She received great  benefit today from manual therapy so that can be continued, and it shows promise that she has changed her habits while at work.  Continue to  promote consistent stretches done throughout the day and coping and management of thumb pain.    PLAN:  OT FREQUENCY: 1x/week  OT DURATION: 6 weeks (through 05/03/23)   PLANNED INTERVENTIONS: self care/ADL training, therapeutic exercise, therapeutic activity, neuromuscular re-education, manual therapy, scar mobilization, gait training, splinting, electrical stimulation, ultrasound, compression bandaging, moist heat, cryotherapy, contrast bath, patient/family education, cognitive remediation/compensation, energy conservation, coping strategies training, DME and/or AE instructions, and Dry needling  CONSULTED AND AGREED WITH PLAN OF CARE: Patient  PLAN FOR NEXT SESSION:  Make sure she is doing well with her stretches especially internal rotation and abduction on the tabletop.  Check on new strengthening techniques with therapy band and ensure that her hands are also still doing very well.  Fannie Knee, OTR/L, CHT 04/26/2023, 10:22 AM

## 2023-04-26 ENCOUNTER — Ambulatory Visit: Payer: 59 | Admitting: Rehabilitative and Restorative Service Providers"

## 2023-04-26 ENCOUNTER — Encounter: Payer: Self-pay | Admitting: Rehabilitative and Restorative Service Providers"

## 2023-04-26 DIAGNOSIS — R278 Other lack of coordination: Secondary | ICD-10-CM

## 2023-04-26 DIAGNOSIS — G8929 Other chronic pain: Secondary | ICD-10-CM

## 2023-04-26 DIAGNOSIS — M25612 Stiffness of left shoulder, not elsewhere classified: Secondary | ICD-10-CM

## 2023-04-26 DIAGNOSIS — M25542 Pain in joints of left hand: Secondary | ICD-10-CM | POA: Diagnosis not present

## 2023-04-26 DIAGNOSIS — M25541 Pain in joints of right hand: Secondary | ICD-10-CM

## 2023-04-26 DIAGNOSIS — M6281 Muscle weakness (generalized): Secondary | ICD-10-CM | POA: Diagnosis not present

## 2023-04-26 DIAGNOSIS — M25512 Pain in left shoulder: Secondary | ICD-10-CM

## 2023-05-03 ENCOUNTER — Encounter: Payer: 59 | Admitting: Rehabilitative and Restorative Service Providers"

## 2023-05-07 NOTE — Therapy (Addendum)
 OUTPATIENT OCCUPATIONAL THERAPY TREATMENT & PROGRESS & DISCHARGE NOTE  Patient Name: Nancy Camacho MRN: [id number] DOB:04/06/1961, 62 y.o., female Today's Date: 05/09/2023  PCP: Nichole Senior, MD REFERRING PROVIDER: Joane Artist RAMAN, MD                      OCCUPATIONAL THERAPY DISCHARGE SUMMARY  Visits from Start of Care: 5 Pt did not show up to additional appointments and could not be reached.  This OT plan of care is now discharged.  Goals could not be addressed. Please see note for any details.   Melvenia Ada, OTR/L, CHT 06/15/24                       Progress Note  Reporting Period 03/25/23 to 05/09/23.   See note below for Objective Data and Assessment of Progress/Goals.      END OF SESSION:  OT End of Session - 05/09/23 0803     Visit Number 5    Number of Visits 11    Date for OT Re-Evaluation 06/14/23    Authorization Type [employer]    OT Start Time 0803    OT Stop Time 0855    OT Time Calculation (min) 52 min    Activity Tolerance Patient tolerated treatment well;Patient limited by pain;Patient limited by fatigue;No increased pain    Behavior During Therapy WFL for tasks assessed/performed             Past Medical History:  Diagnosis Date   Anxiety    Chronic back pain    Depression    Diabetes mellitus without complication (HCC)    DKA (diabetic ketoacidosis) (HCC)    History of Bell's palsy    History of COVID-19    History of seborrhea    ear   Thyroid  disease    Past Surgical History:  Procedure Laterality Date   ABDOMINOPLASTY  07/2005   BREAST ENHANCEMENT SURGERY  04/2006   COLONOSCOPY  03/15/2014   POLYPECTOMY     TUBAL LIGATION     Patient Active Problem List   Diagnosis Date Noted   Arthritis of carpometacarpal (CMC) joint of both thumbs 05/25/2022   Dyspnea on exertion    COVID-19 virus infection 01/16/2021   Elevated troponin 01/16/2021   Sinusitis 01/16/2021   Hypokalemia 01/16/2021   Type 2  diabetes mellitus without complication (HCC) 05/11/2013   Depression 09/30/2011   Menopause 09/30/2011   Vitamin D  deficiency 09/30/2011   Back pain 09/30/2011    ONSET DATE: ~2 years  REFERRING DIAG:  M79.644,M79.645 (ICD-10-CM) - Bilateral thumb pain  M18.0 (ICD-10-CM) - Arthritis of carpometacarpal (CMC) joint of both thumbs  M25.512,G89.29 (ICD-10-CM) - Chronic left shoulder pain    THERAPY DIAG:  Muscle weakness (generalized)  Stiffness of left shoulder, not elsewhere classified  Pain in joint of left hand  Other lack of coordination  Chronic left shoulder pain  Pain in joint of right hand  Rationale for Evaluation and Treatment: Rehabilitation  PERTINENT HISTORY: She has been doing ECSWT with Dr. Joane.  She states both thumbs have been hurting her for ~2 years and now in the past 6 months, Lt shoulder has started to hurt as well- running from neck, down arm to thumb (somewhat like radial nerve distribution). She does wear a glucose monitor on her arm, switches sides. 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 Holding a steering wheel hurts. Lt thumb hurts  most, followed by Rt thumb and Lt shoulder. She wears pre-fab thumb spica braces at night.   PRECAUTIONS: None; WEIGHT BEARING RESTRICTIONS: No   SUBJECTIVE:   SUBJECTIVE STATEMENT: She arrives after missing the past 2 weeks of therapy due to a death in her family.  She has been stressed out and also working more often lately.  It is no surprise that she states increased tightness and pain in both of her thumbs as well as her left shoulder again.   PAIN:  Are you having pain?  Yes: NPRS scale: 7/10 Pain location: b/l thumbs  Pain description: aching in CMC Js, pinching/tight in Lt shoulder  Aggravating factors: motion/working Relieving factors: n/a   PATIENT GOALS: To have less pain in the left shoulder and hands/thumbs to continue to work and do her job  well   OBJECTIVE: (All objective assessments below are from initial evaluation on: 03/25/23 unless otherwise specified.)   HAND DOMINANCE: Right   ADLs: Overall ADLs: States decreased ability to grab, hold household objects, pain and inability to open containers, perform FMS tasks (manipulate fasteners on clothing), mild to moderate bathing problems as well.    FUNCTIONAL OUTCOME MEASURES: 05/09/23: Quick DASH 64% impairment today  (Higher % Score  =  More Impairment)    Eval: Quick DASH 54.5% impairment today  (Higher % Score  =  More Impairment)    UPPER EXTREMITY ROM     Shoulder to Wrist AROM Right eval Left eval Lt 04/11/23 Lt 04/26/23 Lt 05/09/23  Shoulder flexion  106 104 (131* after therapy and DN today)  106 96  Shoulder abduction  90 104 113 62  Shoulder extension  51 56 41 46  Shoulder internal rotation  42 30 33 40  Shoulder external rotation  61 44 50 57  Wrist flexion 68 67 67 68 78  Wrist extension 64 69 74 73 75  (Blank rows = not tested)   Hand AROM Right eval Left eval Lt 05/09/23  Full Fist Ability (or Gap to Distal Palmar Crease) full full   Thumb Opposition  (Kapandji Scale)  10 10   Thumb MCP (0-60) 59 58 64  Thumb IP (0-80) 61 44 56  Thumb Radial Abduction Span 6.2cm  6.5 cm   (Blank rows = not tested)   UPPER EXTREMITY MMT:     MMT Left 03/25/23 Lt 05/09/23  Shoulder flexion    Shoulder abduction    Shoulder adduction    Shoulder extension    Shoulder internal rotation 4+/5 5/5 MMT  Shoulder external rotation 4/5 tender 4/5 MMT tender  (Blank rows = not tested)  HAND FUNCTION: 05/09/23: Grip Rt: 36#,, Lt: 31#   Eval: Observed weakness in affected hand.  Grip strength Right: 38 lbs pain, Left: 48 lbs pain  COORDINATION: Eval: Observed coordination impairments with affected hand. 9 Hole Peg Test Right: 27sec, Left: 27.6 sec   SENSATION: Eval: Light touch intact today, suspicion for radial nerve irritation though  OBSERVATIONS:    Eval: noted muscle spasms in lateral Lt shoulder, also infraspinatus and upper traps, tender to touch. Also TTP to b/l thumb CMC Js.   Presents like tight Lt sh external rotator, b/l thumb CMC J OA    TODAY'S TREATMENT:  05/09/23: Today she discusses her arm functional abilities and rates herself worse than the initial evaluation due to recent exacerbation of pain and problems likely linked to her death in her family, increased stress, likely not performing stretches every day as asked  and missing therapy.  She performs active range of motion for new measures and exercise which shows significant increase in tightness around her left shoulder though her left wrist and thumb are not doing poorly.  Her grip strength is somewhat decreased likely due to her pain exacerbation.  OT reviews trying to change work postures now and again, intermittent stretches, and also performs manual therapy stretches to her in supine at the shoulder in all planes of motion and at the thumb in flexion and abduction and light traction.  At the end of this today she states feeling better and less then 7 out of 10 pain.  She does wish to continue therapy as it has been helpful to her in the past.   PATIENT EDUCATION: Education details: See tx section above for details  Person educated: Patient Education method: Verbal Instruction, Teach back, Handouts  Education comprehension: States and demonstrates understanding, Additional Education required    HOME EXERCISE PROGRAM: Access Code: [email or web address] URL: [email or web address] Date: 04/05/2023 Prepared by: Melvenia Ada   GOALS: Goals reviewed with patient? Yes   SHORT TERM GOALS: (STG required if POC>30 days) Target Date: 04/12/23  Pt will obtain protective, custom orthotic. Goal status: TBD/PRN  2.  Pt will demo/state understanding of initial HEP to improve pain levels and prerequisite motion. Goal status: 04/26/23: progressing    LONG TERM  GOALS: Target Date: 05/10/23  Pt will improve functional ability by decreased impairment per Quick DASH assessment from 54.5% to 20% or better, for better quality of life. Goal status: 05/09/23: NOT MET- worse now   2.  Pt will improve grip strength in Rt hand from 38lbs to at least 50lbs for functional use at home and in IADLs. Goal status: 05/09/23: NOT MET- worse now   3.  Pt will improve A/ROM in Lt sh IR from 42 to at least 55, to have functional motion for tasks like reach and grasp.  Goal status: 05/09/23: NOT MET- worse now   4.  Pt will improve strength in Lt sh ER from 4/5 tender MMT to at least 4+/5 MMT to have increased functional ability to carry out selfcare and higher-level homecare tasks with no difficulty. Goal status: 05/09/23: NOT MET  5.  Pt will improve coordination skills in b/l hands FMS, as seen by better score on 9HPT testing to 23sec or better, to have increased functional ability to carry out fine motor tasks (fasteners, etc.) and more complex, coordinated IADLs (meal prep, sports, etc.).  Goal status: 05/09/23: NT today  6.  Pt will decrease pain at worst from 8/10 to 4/10 or better to have better sleep and occupational participation in daily roles. Goal status: 05/09/23: NOT MET   ASSESSMENT:  CLINICAL IMPRESSION: 05/09/23: Despite recently (2 weeks ago) having little to no thumb pain, feeling much better in her left shoulder, she now feels worse than the initial evaluation due to recent exacerbation of pain and problems likely linked to a death in her family, increased stress, likely not performing stretches every day as asked (is now tighter motion) and missing therapy- only being seen a total of 5 times in 7 weeks.  OT agrees that she should continue therapy as it has been helpful to her, and that she should be trying to find time to stretch every day especially first thing in the morning.  OT also reinforces the ideas of modifying and adapting techniques at work and  at home to reduce stress to her joints and work smarter not  harder.   PLAN:  OT FREQUENCY: 1x/week (she was recommended to come twice a week but states she cannot do that)  OT DURATION: 6 Additional weeks (05/03/23 - 06/14/23)  and 6 more visits   PLANNED INTERVENTIONS: self care/ADL training, therapeutic exercise, therapeutic activity, neuromuscular re-education, manual therapy, scar mobilization, gait training, splinting, electrical stimulation, ultrasound, compression bandaging, moist heat, cryotherapy, contrast bath, patient/family education, cognitive remediation/compensation, energy conservation, coping strategies training, DME and/or AE instructions, and Dry needling  CONSULTED AND AGREED WITH PLAN OF CARE: Patient  PLAN FOR NEXT SESSION:  Use modalities and review comprehensive shoulder stretches carefully.  If time review wrist and thumb stretches.  Avoid strengthening during exacerbation.  Other manual therapy techniques can be utilized as needed.  Melvenia Ada, OTR/L, CHT 05/09/2023, 9:12 AM

## 2023-05-09 ENCOUNTER — Ambulatory Visit: Payer: 59 | Admitting: Rehabilitative and Restorative Service Providers"

## 2023-05-09 ENCOUNTER — Encounter: Payer: Self-pay | Admitting: Rehabilitative and Restorative Service Providers"

## 2023-05-09 DIAGNOSIS — M6281 Muscle weakness (generalized): Secondary | ICD-10-CM | POA: Diagnosis not present

## 2023-05-09 DIAGNOSIS — M25541 Pain in joints of right hand: Secondary | ICD-10-CM

## 2023-05-09 DIAGNOSIS — M25612 Stiffness of left shoulder, not elsewhere classified: Secondary | ICD-10-CM | POA: Diagnosis not present

## 2023-05-09 DIAGNOSIS — G8929 Other chronic pain: Secondary | ICD-10-CM

## 2023-05-09 DIAGNOSIS — R278 Other lack of coordination: Secondary | ICD-10-CM

## 2023-05-09 DIAGNOSIS — M25542 Pain in joints of left hand: Secondary | ICD-10-CM

## 2023-05-09 DIAGNOSIS — M25512 Pain in left shoulder: Secondary | ICD-10-CM

## 2023-05-15 ENCOUNTER — Encounter: Payer: Self-pay | Admitting: Family Medicine

## 2023-05-17 ENCOUNTER — Encounter: Payer: 59 | Admitting: Rehabilitative and Restorative Service Providers"

## 2023-05-24 ENCOUNTER — Encounter: Payer: Self-pay | Admitting: Rehabilitative and Restorative Service Providers"

## 2023-05-31 ENCOUNTER — Encounter: Payer: Self-pay | Admitting: Rehabilitative and Restorative Service Providers"

## 2023-06-03 ENCOUNTER — Other Ambulatory Visit: Payer: Self-pay | Admitting: Family Medicine

## 2023-06-03 MED ORDER — GABAPENTIN 100 MG PO CAPS: 200.0000 mg | ORAL_CAPSULE | Freq: Every evening | ORAL | 0 refills | Status: DC

## 2023-06-03 NOTE — Progress Notes (Deleted)
[name] [address] [place] [address] Phone: [phone] Subjective:    I'm seeing this patient by the request  of:  Adrian Prince, MD  CC:   [id number]  03/29/2023 Worsening pain again.  Doing taping and Occupational Therapy. We discussed potential different injections.  Skin patient has elected to try Cymbalta.  Warned of potential side effects and will titrate off other antidepressant medicine.  Hopefully this will help with some of the pain.  Follow-up with me again in 6 to 8 weeks      Update 06/07/2023 Nancy Camacho is a 62 y.o. female coming in with complaint of B thumb pain. Patient states       Past Medical History:  Diagnosis Date   Anxiety    Chronic back pain    Depression    Diabetes mellitus without complication (HCC)    DKA (diabetic ketoacidosis) (HCC)    History of Bell's palsy    History of COVID-19    History of seborrhea    ear   Thyroid disease    Past Surgical History:  Procedure Laterality Date   ABDOMINOPLASTY  07/2005   BREAST ENHANCEMENT SURGERY  04/2006   COLONOSCOPY  03/15/2014   POLYPECTOMY     TUBAL LIGATION     Social History   Socioeconomic History   Marital status: Married    Spouse name: Not on file   Number of children: Not on file   Years of education: Not on file   Highest education level: Not on file  Occupational History   Not on file  Tobacco Use   Smoking status: Never   Smokeless tobacco: Never  Vaping Use   Vaping Use: Never used  Substance and Sexual Activity   Alcohol use: No   Drug use: No   Sexual activity: Yes    Birth control/protection: None  Other Topics Concern   Not on file  Social History Narrative   Not on file   Social Determinants of Health   Financial Resource Strain: Not on file  Food Insecurity: Not on file  Transportation Needs: Not on file  Physical Activity: Not on file  Stress: Not on file  Social Connections: Not on file    Allergies  Allergen Reactions   Flexeril [Cyclobenzaprine] Other (See Comments)    "generic flexeril", r/t bell's palsy   Family History  Problem Relation Age of Onset   Diabetes Father    Atrial fibrillation Mother    Colon cancer Neg Hx    Pancreatic cancer Neg Hx    Rectal cancer Neg Hx    Stomach cancer Neg Hx    Colon polyps Neg Hx    Esophageal cancer Neg Hx     Current Outpatient Medications (Endocrine & Metabolic):    estradiol (ESTRACE) 0.5 MG tablet, Take 0.5 mg by mouth daily.   levothyroxine (SYNTHROID) 75 MCG tablet, Take 75 mcg by mouth every morning.   Semaglutide, 1 MG/DOSE, (OZEMPIC, 1 MG/DOSE,) 4 MG/3ML SOPN, Inject 1 mg into the skin every 7 (seven) days.    Current Outpatient Medications (Analgesics):    acetaminophen (TYLENOL) 500 MG tablet, Take 1,000 mg by mouth every 6 (six) hours as needed for headache (pain).   celecoxib (CELEBREX) 200 MG capsule, Take 1 capsule (200 mg total) by mouth 2 (two) times daily.   Current Outpatient Medications (Other):    DULoxetine (CYMBALTA) 30 MG capsule, Take 1 capsule (30 mg total) by mouth daily.  ADDERALL XR 20 MG 24 hr capsule, Take 20 mg by mouth every morning.   gabapentin (NEURONTIN) 100 MG capsule, Take 2 capsules (200 mg total) by mouth at bedtime.   MAGNESIUM-ZINC PO, Take 1 tablet by mouth at bedtime.   NON FORMULARY, Take 1 tablet by mouth daily at 12 noon. Tart cherry extract 1200 mg   ondansetron (ZOFRAN-ODT) 8 MG disintegrating tablet, Take 4-8 mg by mouth 2 (two) times daily.   Vitamin D, Ergocalciferol, (DRISDOL) 1.25 MG (50000 UNIT) CAPS capsule, Take 50,000 Units by mouth every Tuesday.   vortioxetine HBr (TRINTELLIX) 20 MG TABS tablet, Take 20 mg by mouth every morning.   Reviewed prior external information including notes and imaging from  primary care provider As well as notes that were available from care everywhere and other healthcare systems.  Past medical history, social, surgical  and family history all reviewed in electronic medical record.  No pertanent information unless stated regarding to the chief complaint.   Review of Systems:  No headache, visual changes, nausea, vomiting, diarrhea, constipation, dizziness, abdominal pain, skin rash, fevers, chills, night sweats, weight loss, swollen lymph nodes, body aches, joint swelling, chest pain, shortness of breath, mood changes. POSITIVE muscle aches  Objective  Last menstrual period 09/17/2011.   General: No apparent distress alert and oriented x3 mood and affect normal, dressed appropriately.  HEENT: Pupils equal, extraocular movements intact  Respiratory: Patient's speak in full sentences and does not appear short of breath  Cardiovascular: No lower extremity edema, non tender, no erythema      Impression and Recommendations:

## 2023-06-06 ENCOUNTER — Encounter: Payer: Self-pay | Admitting: Rehabilitative and Restorative Service Providers"

## 2023-06-07 ENCOUNTER — Ambulatory Visit: Payer: 59 | Admitting: Family Medicine

## 2023-06-21 ENCOUNTER — Encounter: Payer: Self-pay | Admitting: Rehabilitative and Restorative Service Providers"

## 2023-07-04 ENCOUNTER — Encounter: Payer: Self-pay | Admitting: Cardiology

## 2023-07-04 ENCOUNTER — Ambulatory Visit: Payer: 59 | Admitting: Cardiology

## 2023-07-04 VITALS — BP 108/72 | HR 95 | Resp 16 | Ht 69.0 in | Wt 170.4 lb

## 2023-07-04 DIAGNOSIS — E782 Mixed hyperlipidemia: Secondary | ICD-10-CM

## 2023-07-04 DIAGNOSIS — E039 Hypothyroidism, unspecified: Secondary | ICD-10-CM

## 2023-07-04 DIAGNOSIS — E1165 Type 2 diabetes mellitus with hyperglycemia: Secondary | ICD-10-CM

## 2023-07-04 NOTE — Progress Notes (Signed)
ID:  Nancy Camacho, DOB 1961/04/04, MRN [id number]  PCP:  Adrian Prince, MD  Cardiologist: Tessa Lerner, DO, [facility] (established care 04/06/2021)  Date: 07/04/23 Last Office Visit: 06/30/2023   Chief Complaint  Patient presents with   Mixed hyperlipidemia    HPI  Nancy Camacho is a 62 y.o. female whose past medical history and cardiovascular risk factors include: Hyperlipidemia, Insulin-dependent diabetes mellitus LADA on insulin pump, hypothyroidism, anxiety/depression, back pain, COVID-19 infection, hormone replacement therapy, postmenopausal female.  Initially referred to the practice for elevated troponins after hospitalization in February 2022 for diabetic ketoacidosis and COVID-19 infection.  Since establishing care she is undergone a very thorough cardiovascular workup which included a coronary CTA-she was noted to have a total coronary calcium score of 0 and CAD RADS of 0 indicatve of no CAD.  Since then been focusing on primary prevention.  Given her insulin-dependent diabetes patient was recommended to be on statin therapy in the past to reduce her LDL.  She was on atorvastatin 20 mg p.o. nightly but due to myalgias it was discontinued.  She was getting too ready strays and her lipid profile had improved with an LDL level of 56 mg/dL in July 4098.  She presents today for 1 year follow-up visit.  Since last visit she denies any anginal discomfort.  However she has some nonspecific symptoms that occur intermittently.  No exertional shortness of breath or heart failure symptoms.  No structured exercise program or daily routine but still getting about 6,000 - 7,000 steps per day.  Outside labs from July 2024 independently reviewed and noted below for further reference.  FUNCTIONAL STATUS: No structured exercise program or daily routine.   ALLERGIES: Allergies  Allergen Reactions   Flexeril [Cyclobenzaprine] Other (See Comments)    "generic flexeril", r/t bell's palsy     MEDICATION LIST PRIOR TO VISIT: Current Meds  Medication Sig   acetaminophen (TYLENOL) 500 MG tablet Take 1,000 mg by mouth every 6 (six) hours as needed for headache (pain).   ADDERALL XR 20 MG 24 hr capsule Take 20 mg by mouth every morning.   celecoxib (CELEBREX) 200 MG capsule Take 1 capsule (200 mg total) by mouth 2 (two) times daily.   estradiol (ESTRACE) 0.5 MG tablet Take 0.5 mg by mouth daily.   levothyroxine (SYNTHROID) 75 MCG tablet Take 75 mcg by mouth every morning.   MAGNESIUM-ZINC PO Take 1 tablet by mouth at bedtime.   NON FORMULARY Take 1 tablet by mouth daily at 12 noon. Tart cherry extract 1200 mg   ondansetron (ZOFRAN-ODT) 8 MG disintegrating tablet Take 4-8 mg by mouth 2 (two) times daily.   Semaglutide, 1 MG/DOSE, (OZEMPIC, 1 MG/DOSE,) 4 MG/3ML SOPN Inject 1 mg into the skin every 7 (seven) days.   Vitamin D, Ergocalciferol, (DRISDOL) 1.25 MG (50000 UNIT) CAPS capsule Take 50,000 Units by mouth every Tuesday.   vortioxetine HBr (TRINTELLIX) 20 MG TABS tablet Take 20 mg by mouth every morning.   [DISCONTINUED] DULoxetine (CYMBALTA) 30 MG capsule Take 1 capsule (30 mg total) by mouth daily.     PAST MEDICAL HISTORY: Past Medical History:  Diagnosis Date   Anxiety    Chronic back pain    Depression    Diabetes mellitus without complication (HCC)    DKA (diabetic ketoacidosis) (HCC)    History of Bell's palsy    History of COVID-19    History of seborrhea    ear   Thyroid disease     PAST  SURGICAL HISTORY: Past Surgical History:  Procedure Laterality Date   ABDOMINOPLASTY  07/2005   BREAST ENHANCEMENT SURGERY  04/2006   COLONOSCOPY  03/15/2014   POLYPECTOMY     TUBAL LIGATION      FAMILY HISTORY: The patient family history includes Atrial fibrillation in her mother; Diabetes in her father.  SOCIAL HISTORY:  The patient  reports that she has never smoked. She has never used smokeless tobacco. She reports that she does not drink alcohol and does not  use drugs.  REVIEW OF SYSTEMS: Review of Systems  Constitutional: Negative for chills and fever.  HENT:  Negative for hoarse voice and nosebleeds.   Eyes:  Negative for discharge, double vision and pain.  Cardiovascular:  Negative for chest pain, claudication, dyspnea on exertion, leg swelling, near-syncope, orthopnea, palpitations, paroxysmal nocturnal dyspnea and syncope.  Respiratory:  Negative for hemoptysis and shortness of breath.   Musculoskeletal:  Negative for muscle cramps and myalgias.  Gastrointestinal:  Negative for abdominal pain, constipation, diarrhea, hematemesis, hematochezia, melena, nausea and vomiting.  Neurological:  Negative for dizziness and light-headedness.    PHYSICAL EXAM:    07/04/2023    9:05 AM 03/29/2023    8:38 AM 02/28/2023    8:30 AM  Vitals with BMI  Height 5\' 9"  5\' 9"  5\' 9"   Weight 170 lbs 6 oz 168 lbs 168 lbs  BMI 25.15 24.8 24.8  Systolic 108 122 161  Diastolic 72 72 82  Pulse 95 87 95    Physical Exam  Constitutional: No distress.  Age appropriate, hemodynamically stable.   Neck: No JVD present.  Cardiovascular: Normal rate, regular rhythm, S1 normal, S2 normal, intact distal pulses and normal pulses. Exam reveals no gallop, no S3 and no S4.  No murmur heard. Pulmonary/Chest: Effort normal and breath sounds normal. No stridor. She has no wheezes. She has no rales.  Abdominal: Soft. Bowel sounds are normal. She exhibits no distension. There is no abdominal tenderness.  Musculoskeletal:        General: No edema.     Cervical back: Neck supple.  Neurological: She is alert and oriented to person, place, and time. She has intact cranial nerves (2-12).  Skin: Skin is warm and moist.   CARDIAC DATABASE: EKG: July 04, 2023: Sinus rhythm, 93 bpm, low voltage, without underlying injury pattern.  Echocardiogram: 01/16/2021: Limited echocardiogram LVEF 70-75%, right ventricular systolic size and function normal.  No significant valvular heart  disease.  04/27/2021: Normal LV systolic function with visual EF 55-60%. Left ventricle cavity is normal in size. Normal left ventricular wall thickness. Normal global wall motion. Normal diastolic filling pattern, normal LAP.  No significant valvular heart disease. Compared to prior study 01/16/2021 no significant change.   Stress Testing: No results found for this or any previous visit from the past 1095 days.  CCTA 04/18/2021: 1. Total coronary calcium score of 0. 2. Normal coronary origin with right dominance. 3. CAD-RADS = 0. Non cardiac findings: Left breast nodule of 1.1 cm, partially calcified. Possibly a fibroadenoma, but nonspecific. Correlate with diagnostic mammogram and ultrasound.  Heart Catheterization: None  LABORATORY DATA:    Latest Ref Rng & Units 01/17/2021    2:50 AM 01/16/2021    6:42 AM 01/15/2021    2:00 PM  CBC  WBC 4.0 - 10.5 K/uL 5.2  9.4  15.5   Hemoglobin 12.0 - 15.0 g/dL 09.6  04.5  40.9   Hematocrit 36.0 - 46.0 % 36.3  36.3  42.7  Platelets 150 - 400 K/uL 211  237  291        Latest Ref Rng & Units 08/02/2022    9:12 AM 01/18/2021    2:50 AM 01/17/2021    2:50 AM  CMP  Glucose 70 - 99 mg/dL  725  96   BUN 6 - 20 mg/dL  9  14   Creatinine 3.66 - 1.00 mg/dL  4.40  3.47   Sodium 425 - 145 mmol/L  137  136   Potassium 3.5 - 5.1 mmol/L  3.0  2.9   Chloride 98 - 111 mmol/L  102  100   CO2 22 - 32 mmol/L  23  25   Calcium 8.6 - 10.4 mg/dL 9.0  8.3  8.1   Total Protein 6.5 - 8.1 g/dL   5.3   Total Bilirubin 0.3 - 1.2 mg/dL   1.0   Alkaline Phos 38 - 126 U/L   46   AST 15 - 41 U/L   57   ALT 0 - 44 U/L   49     Lipid Panel     Component Value Date/Time   CHOL 135 01/17/2021 0250   TRIG 67 01/17/2021 0250   HDL 47 01/17/2021 0250   CHOLHDL 2.9 01/17/2021 0250   VLDL 13 01/17/2021 0250   LDLCALC 75 01/17/2021 0250    No components found for: "NTPROBNP" No results for input(s): "PROBNP" in the last 8760 hours. Recent Labs     08/02/22 0912  TSH 1.18     BMP Recent Labs    08/02/22 0912  CALCIUM 9.0     HEMOGLOBIN A1C Lab Results  Component Value Date   HGBA1C 7.7 (H) 01/16/2021   MPG 174.29 01/16/2021   External Labs:  Date Collected: 03/23/2021 , information obtained by [facility] Potassium: 4.7 Creatinine 0.8 mg/dL. eGFR: 73.9 mL/min per 1.73 m AST: 20 , ALT: 25 , alkaline phosphatase: 76  TSH: 1.06  08/31/2020: Hemoglobin: 14.4 g/dL and hematocrit: 95.6 %  01/12/2021: Hemoglobin A1c: 7.0  External Labs: Collected: 06/22/2022. A1c 6.7. Total cholesterol 137, triglycerides 34, HDL 74, LDL 56, non-HDL 63 BUN 16, creatinine 0.7. eGFR 85.6. Sodium 139, potassium 4.2, chloride 106, bicarb 28, AST 15, ALT 18, alkaline phosphatase 73. Hemoglobin 12.8 g/dL, 38.7% hematocrit.   TSH 1.08.  External Labs: Collected: 06/25/2023 provided by the patient. Total cholesterol 169, triglycerides 38, HDL 79, LDL 82, non-HDL 90 BUN 18, creatinine 0.8. Sodium 138, potassium 4.3, chloride 107, bicarb 27. Hemoglobin 12.2 g/dL. TSH 1.42  IMPRESSION:    ICD-10-CM   1. Mixed hyperlipidemia  E78.2 EKG 12-Lead    2. Type 2 diabetes mellitus with hyperglycemia, with long-term current use of insulin (HCC)  E11.65    Z79.4     3. Hypothyroidism, unspecified type  E03.9        RECOMMENDATIONS: Nancy Camacho is a 62 y.o. female whose past medical history and cardiac risk factors include:  Insulin-dependent diabetes mellitus LADA on insulin pump, hypothyroidism, anxiety/depression, back pain, COVID-19 infection, hormone replacement therapy, postmenopausal female.  Mixed hyperlipidemia In July 2023 her LDL levels were 56 mg/dL. In July 2024 her LDL levels were 82 mg/dL. Patient has stopped taking red yeast rice.   Encouraged her to restart Red yeast rice as the numbers had improved in the past. We also discussed that Nexletol/Nexlizet also be considered for primary prevention to help  manage her lipids. For now she will restart red yeast rice and will  have lipids rechecked with PCP in December 2024.    Type 2 diabetes mellitus with hyperglycemia, with long-term current use of insulin (HCC) Reemphasized importance of glycemic control. Medications reconciled. Currently on insulin pump and Ozempic.  Hypothyroidism, unspecified type Currently on medical therapy. TSH within normal limits  Discussed management of least 2 chronic comorbid conditions, outside labs from July 2024 independently reviewed and noted above for further reference.  FINAL MEDICATION LIST END OF ENCOUNTER: No orders of the defined types were placed in this encounter.   Medications Discontinued During This Encounter  Medication Reason   DULoxetine (CYMBALTA) 30 MG capsule    gabapentin (NEURONTIN) 100 MG capsule      Current Outpatient Medications:    acetaminophen (TYLENOL) 500 MG tablet, Take 1,000 mg by mouth every 6 (six) hours as needed for headache (pain)., Disp: , Rfl:    ADDERALL XR 20 MG 24 hr capsule, Take 20 mg by mouth every morning., Disp: , Rfl:    celecoxib (CELEBREX) 200 MG capsule, Take 1 capsule (200 mg total) by mouth 2 (two) times daily., Disp: 180 capsule, Rfl: 3   estradiol (ESTRACE) 0.5 MG tablet, Take 0.5 mg by mouth daily., Disp: , Rfl:    levothyroxine (SYNTHROID) 75 MCG tablet, Take 75 mcg by mouth every morning., Disp: , Rfl:    MAGNESIUM-ZINC PO, Take 1 tablet by mouth at bedtime., Disp: , Rfl:    NON FORMULARY, Take 1 tablet by mouth daily at 12 noon. Tart cherry extract 1200 mg, Disp: , Rfl:    ondansetron (ZOFRAN-ODT) 8 MG disintegrating tablet, Take 4-8 mg by mouth 2 (two) times daily., Disp: , Rfl:    Semaglutide, 1 MG/DOSE, (OZEMPIC, 1 MG/DOSE,) 4 MG/3ML SOPN, Inject 1 mg into the skin every 7 (seven) days., Disp: , Rfl:    Vitamin D, Ergocalciferol, (DRISDOL) 1.25 MG (50000 UNIT) CAPS capsule, Take 50,000 Units by mouth every Tuesday., Disp: , Rfl:     vortioxetine HBr (TRINTELLIX) 20 MG TABS tablet, Take 20 mg by mouth every morning., Disp: , Rfl:   Orders Placed This Encounter  Procedures   EKG 12-Lead   There are no Patient Instructions on file for this visit.   --Continue cardiac medications as reconciled in final medication list. --Return in about 1 year (around 07/03/2024) for Follow up, Lipid. Or sooner if needed. --Continue follow-up with your primary care physician regarding the management of your other chronic comorbid conditions.  Patient's questions and concerns were addressed to her satisfaction. She voices understanding of the instructions provided during this encounter.   This note was created using a voice recognition software as a result there may be grammatical errors inadvertently enclosed that do not reflect the nature of this encounter. Every attempt is made to correct such errors.  Tessa Lerner, [place], [facility]  Pager:  [phone] Office: [phone]

## 2023-07-18 NOTE — Progress Notes (Deleted)
[name] [address] [place] [address] Phone: [phone] Subjective:    I'm seeing this patient by the request  of:  Adrian Prince, MD  CC:   [id number]  03/29/2023 Worsening pain again.  Doing taping and Occupational Therapy. We discussed potential different injections.  Skin patient has elected to try Cymbalta.  Warned of potential side effects and will titrate off other antidepressant medicine.  Hopefully this will help with some of the pain.  Follow-up with me again in 6 to 8 weeks      Update 07/19/2023 Nancy Camacho is a 62 y.o. female coming in with complaint of B thumb pain. Patient states        Past Medical History:  Diagnosis Date   Anxiety    Chronic back pain    Depression    Diabetes mellitus without complication (HCC)    DKA (diabetic ketoacidosis) (HCC)    History of Bell's palsy    History of COVID-19    History of seborrhea    ear   Thyroid disease    Past Surgical History:  Procedure Laterality Date   ABDOMINOPLASTY  07/2005   BREAST ENHANCEMENT SURGERY  04/2006   COLONOSCOPY  03/15/2014   POLYPECTOMY     TUBAL LIGATION     Social History   Socioeconomic History   Marital status: Married    Spouse name: Not on file   Number of children: Not on file   Years of education: Not on file   Highest education level: Not on file  Occupational History   Not on file  Tobacco Use   Smoking status: Never   Smokeless tobacco: Never  Vaping Use   Vaping status: Never Used  Substance and Sexual Activity   Alcohol use: No   Drug use: No   Sexual activity: Yes    Birth control/protection: None  Other Topics Concern   Not on file  Social History Narrative   Not on file   Social Determinants of Health   Financial Resource Strain: Not on file  Food Insecurity: Not on file  Transportation Needs: Not on file  Physical Activity: Not on file  Stress: Not on file  Social Connections: Not on file    Allergies  Allergen Reactions   Flexeril [Cyclobenzaprine] Other (See Comments)    "generic flexeril", r/t bell's palsy   Family History  Problem Relation Age of Onset   Diabetes Father    Atrial fibrillation Mother    Colon cancer Neg Hx    Pancreatic cancer Neg Hx    Rectal cancer Neg Hx    Stomach cancer Neg Hx    Colon polyps Neg Hx    Esophageal cancer Neg Hx     Current Outpatient Medications (Endocrine & Metabolic):    estradiol (ESTRACE) 0.5 MG tablet, Take 0.5 mg by mouth daily.   levothyroxine (SYNTHROID) 75 MCG tablet, Take 75 mcg by mouth every morning.   Semaglutide, 1 MG/DOSE, (OZEMPIC, 1 MG/DOSE,) 4 MG/3ML SOPN, Inject 1 mg into the skin every 7 (seven) days.    Current Outpatient Medications (Analgesics):    acetaminophen (TYLENOL) 500 MG tablet, Take 1,000 mg by mouth every 6 (six) hours as needed for headache (pain).   celecoxib (CELEBREX) 200 MG capsule, Take 1 capsule (200 mg total) by mouth 2 (two) times daily.   Current Outpatient Medications (Other):    ADDERALL XR 20 MG 24 hr capsule, Take 20 mg by mouth every  morning.   MAGNESIUM-ZINC PO, Take 1 tablet by mouth at bedtime.   NON FORMULARY, Take 1 tablet by mouth daily at 12 noon. Tart cherry extract 1200 mg   ondansetron (ZOFRAN-ODT) 8 MG disintegrating tablet, Take 4-8 mg by mouth 2 (two) times daily.   Vitamin D, Ergocalciferol, (DRISDOL) 1.25 MG (50000 UNIT) CAPS capsule, Take 50,000 Units by mouth every Tuesday.   vortioxetine HBr (TRINTELLIX) 20 MG TABS tablet, Take 20 mg by mouth every morning.   Reviewed prior external information including notes and imaging from  primary care provider As well as notes that were available from care everywhere and other healthcare systems.  Past medical history, social, surgical and family history all reviewed in electronic medical record.  No pertanent information unless stated regarding to the chief complaint.   Review of Systems:  No headache, visual  changes, nausea, vomiting, diarrhea, constipation, dizziness, abdominal pain, skin rash, fevers, chills, night sweats, weight loss, swollen lymph nodes, body aches, joint swelling, chest pain, shortness of breath, mood changes. POSITIVE muscle aches  Objective  Last menstrual period 09/17/2011.   General: No apparent distress alert and oriented x3 mood and affect normal, dressed appropriately.  HEENT: Pupils equal, extraocular movements intact  Respiratory: Patient's speak in full sentences and does not appear short of breath  Cardiovascular: No lower extremity edema, non tender, no erythema      Impression and Recommendations:

## 2023-07-19 ENCOUNTER — Ambulatory Visit: Payer: 59 | Admitting: Family Medicine

## 2023-08-09 ENCOUNTER — Other Ambulatory Visit: Payer: Self-pay | Admitting: Family Medicine

## 2024-01-29 ENCOUNTER — Other Ambulatory Visit: Payer: Self-pay

## 2024-01-30 LAB — SURGICAL PATHOLOGY

## 2024-04-23 ENCOUNTER — Other Ambulatory Visit: Payer: Self-pay | Admitting: Medical Genetics

## 2024-07-02 ENCOUNTER — Ambulatory Visit: Payer: Self-pay | Admitting: Cardiology

## 2024-09-22 ENCOUNTER — Other Ambulatory Visit: Payer: Self-pay | Admitting: Medical Genetics

## 2024-09-22 DIAGNOSIS — Z006 Encounter for examination for normal comparison and control in clinical research program: Secondary | ICD-10-CM
